# Patient Record
Sex: Female | Born: 1935 | Race: White | Hispanic: No | Smoking: Never smoker
Health system: Southern US, Community
[De-identification: ages and names within clinical notes are randomized; demographics above are authoritative.]

## PROBLEM LIST (undated history)

## (undated) DIAGNOSIS — I499 Cardiac arrhythmia, unspecified: Secondary | ICD-10-CM

## (undated) DIAGNOSIS — H269 Unspecified cataract: Secondary | ICD-10-CM

## (undated) DIAGNOSIS — I4891 Unspecified atrial fibrillation: Secondary | ICD-10-CM

## (undated) DIAGNOSIS — H3530 Unspecified macular degeneration: Secondary | ICD-10-CM

---

## 2005-02-20 ENCOUNTER — Ambulatory Visit: Payer: Self-pay

## 2005-05-02 ENCOUNTER — Ambulatory Visit: Payer: Self-pay | Admitting: Internal Medicine

## 2005-05-17 ENCOUNTER — Ambulatory Visit: Payer: Self-pay | Admitting: Gastroenterology

## 2005-08-02 ENCOUNTER — Ambulatory Visit: Payer: Self-pay | Admitting: Gastroenterology

## 2005-11-11 ENCOUNTER — Ambulatory Visit: Payer: Self-pay | Admitting: Gastroenterology

## 2005-11-20 ENCOUNTER — Ambulatory Visit: Payer: Self-pay | Admitting: Gastroenterology

## 2006-05-15 ENCOUNTER — Ambulatory Visit: Payer: Self-pay | Admitting: Unknown Physician Specialty

## 2007-05-27 ENCOUNTER — Ambulatory Visit: Payer: Self-pay | Admitting: Unknown Physician Specialty

## 2008-06-02 ENCOUNTER — Ambulatory Visit: Payer: Self-pay | Admitting: Unknown Physician Specialty

## 2009-06-21 ENCOUNTER — Ambulatory Visit: Payer: Self-pay | Admitting: Unknown Physician Specialty

## 2010-08-14 ENCOUNTER — Ambulatory Visit: Payer: Self-pay | Admitting: Unknown Physician Specialty

## 2013-01-25 ENCOUNTER — Emergency Department: Payer: Self-pay | Admitting: Emergency Medicine

## 2015-12-31 ENCOUNTER — Encounter
Admission: RE | Admit: 2015-12-31 | Discharge: 2015-12-31 | Disposition: A | Discharge status: Home / Self Care | Payer: Commercial Managed Care - HMO | Source: Ambulatory Visit | Attending: Internal Medicine | Admitting: Internal Medicine

## 2015-12-31 DIAGNOSIS — R262 Difficulty in walking, not elsewhere classified: Secondary | ICD-10-CM | POA: Insufficient documentation

## 2015-12-31 DIAGNOSIS — R410 Disorientation, unspecified: Secondary | ICD-10-CM | POA: Diagnosis present

## 2015-12-31 DIAGNOSIS — Z87440 Personal history of urinary (tract) infections: Secondary | ICD-10-CM | POA: Insufficient documentation

## 2015-12-31 LAB — COMPREHENSIVE METABOLIC PANEL
ALK PHOS: 95 U/L (ref 38–126)
ALT: 16 U/L (ref 14–54)
ANION GAP: 11 (ref 5–15)
AST: 26 U/L (ref 15–41)
Albumin: 3.6 g/dL (ref 3.5–5.0)
BILIRUBIN TOTAL: 0.9 mg/dL (ref 0.3–1.2)
BUN: 17 mg/dL (ref 6–20)
CHLORIDE: 98 mmol/L — AB (ref 101–111)
CO2: 27 mmol/L (ref 22–32)
Calcium: 9.2 mg/dL (ref 8.9–10.3)
Creatinine, Ser: 1.03 mg/dL — ABNORMAL HIGH (ref 0.44–1.00)
GFR calc Af Amer: 55 mL/min — ABNORMAL LOW (ref >60)
GFR calc non Af Amer: 47 mL/min — ABNORMAL LOW (ref >60)
GLUCOSE: 100 mg/dL — AB (ref 65–99)
POTASSIUM: 3.5 mmol/L (ref 3.5–5.1)
Sodium: 136 mmol/L (ref 135–145)
TOTAL PROTEIN: 7.2 g/dL (ref 6.5–8.1)

## 2015-12-31 LAB — CBC WITH DIFFERENTIAL/PLATELET
BASOS ABS: 0.1 10*3/uL (ref 0–0.1)
BASOS PCT: 1 %
Eosinophils Absolute: 0.3 10*3/uL (ref 0–0.7)
Eosinophils Relative: 3 %
HEMATOCRIT: 42.1 % (ref 35.0–47.0)
HEMOGLOBIN: 14.1 g/dL (ref 12.0–16.0)
LYMPHS PCT: 29 %
Lymphs Abs: 2.9 10*3/uL (ref 1.0–3.6)
MCH: 30.9 pg (ref 26.0–34.0)
MCHC: 33.4 g/dL (ref 32.0–36.0)
MCV: 92.6 fL (ref 80.0–100.0)
MONOS PCT: 11 %
Monocytes Absolute: 1.1 10*3/uL — ABNORMAL HIGH (ref 0.2–0.9)
NEUTROS ABS: 5.7 10*3/uL (ref 1.4–6.5)
NEUTROS PCT: 56 %
PLATELETS: 256 10*3/uL (ref 150–440)
RBC: 4.55 MIL/uL (ref 3.80–5.20)
RDW: 13.3 % (ref 11.5–14.5)
WBC: 10.1 10*3/uL (ref 3.6–11.0)

## 2016-01-02 ENCOUNTER — Other Ambulatory Visit: Payer: Self-pay | Admitting: Gerontology

## 2016-01-02 DIAGNOSIS — S22000A Wedge compression fracture of unspecified thoracic vertebra, initial encounter for closed fracture: Secondary | ICD-10-CM

## 2016-01-11 ENCOUNTER — Ambulatory Visit: Payer: Self-pay

## 2016-01-13 DIAGNOSIS — R410 Disorientation, unspecified: Secondary | ICD-10-CM | POA: Diagnosis not present

## 2016-01-13 LAB — URINALYSIS COMPLETE WITH MICROSCOPIC (ARMC ONLY)
BACTERIA UA: NONE SEEN
BILIRUBIN URINE: NEGATIVE
GLUCOSE, UA: NEGATIVE mg/dL
Hgb urine dipstick: NEGATIVE
Ketones, ur: NEGATIVE mg/dL
NITRITE: NEGATIVE
Protein, ur: NEGATIVE mg/dL
SPECIFIC GRAVITY, URINE: 1.005 (ref 1.005–1.030)
pH: 7 (ref 5.0–8.0)

## 2016-01-14 ENCOUNTER — Encounter (HOSPITAL_COMMUNITY)
Admission: EM | Disposition: A | Discharge status: Hospice | Payer: Self-pay | Source: Home / Self Care | Attending: Neurology

## 2016-01-14 ENCOUNTER — Emergency Department (HOSPITAL_COMMUNITY): Payer: Commercial Managed Care - HMO

## 2016-01-14 ENCOUNTER — Inpatient Hospital Stay (HOSPITAL_COMMUNITY): Payer: Commercial Managed Care - HMO | Admitting: Anesthesiology

## 2016-01-14 ENCOUNTER — Encounter (HOSPITAL_COMMUNITY): Payer: Self-pay

## 2016-01-14 ENCOUNTER — Inpatient Hospital Stay (HOSPITAL_COMMUNITY): Payer: Commercial Managed Care - HMO

## 2016-01-14 ENCOUNTER — Inpatient Hospital Stay (HOSPITAL_COMMUNITY)
Admission: EM | Admit: 2016-01-14 | Discharge: 2016-01-19 | DRG: 023 | Disposition: A | Discharge status: Hospice | Payer: Commercial Managed Care - HMO | Attending: Neurology | Admitting: Neurology

## 2016-01-14 DIAGNOSIS — I639 Cerebral infarction, unspecified: Secondary | ICD-10-CM

## 2016-01-14 DIAGNOSIS — H3530 Unspecified macular degeneration: Secondary | ICD-10-CM | POA: Diagnosis present

## 2016-01-14 DIAGNOSIS — I159 Secondary hypertension, unspecified: Secondary | ICD-10-CM

## 2016-01-14 DIAGNOSIS — Z66 Do not resuscitate: Secondary | ICD-10-CM | POA: Diagnosis present

## 2016-01-14 DIAGNOSIS — G8104 Flaccid hemiplegia affecting left nondominant side: Secondary | ICD-10-CM | POA: Diagnosis present

## 2016-01-14 DIAGNOSIS — E785 Hyperlipidemia, unspecified: Secondary | ICD-10-CM | POA: Diagnosis present

## 2016-01-14 DIAGNOSIS — I6789 Other cerebrovascular disease: Secondary | ICD-10-CM | POA: Diagnosis not present

## 2016-01-14 DIAGNOSIS — R471 Dysarthria and anarthria: Secondary | ICD-10-CM | POA: Diagnosis present

## 2016-01-14 DIAGNOSIS — J9620 Acute and chronic respiratory failure, unspecified whether with hypoxia or hypercapnia: Secondary | ICD-10-CM | POA: Diagnosis not present

## 2016-01-14 DIAGNOSIS — I610 Nontraumatic intracerebral hemorrhage in hemisphere, subcortical: Secondary | ICD-10-CM | POA: Diagnosis present

## 2016-01-14 DIAGNOSIS — D72829 Elevated white blood cell count, unspecified: Secondary | ICD-10-CM | POA: Diagnosis present

## 2016-01-14 DIAGNOSIS — E876 Hypokalemia: Secondary | ICD-10-CM | POA: Diagnosis present

## 2016-01-14 DIAGNOSIS — R29721 NIHSS score 21: Secondary | ICD-10-CM | POA: Diagnosis present

## 2016-01-14 DIAGNOSIS — R4781 Slurred speech: Secondary | ICD-10-CM | POA: Diagnosis present

## 2016-01-14 DIAGNOSIS — Z515 Encounter for palliative care: Secondary | ICD-10-CM | POA: Diagnosis not present

## 2016-01-14 DIAGNOSIS — I10 Essential (primary) hypertension: Secondary | ICD-10-CM | POA: Diagnosis present

## 2016-01-14 DIAGNOSIS — I4891 Unspecified atrial fibrillation: Secondary | ICD-10-CM

## 2016-01-14 DIAGNOSIS — G936 Cerebral edema: Secondary | ICD-10-CM | POA: Diagnosis present

## 2016-01-14 DIAGNOSIS — R29810 Facial weakness: Secondary | ICD-10-CM | POA: Diagnosis present

## 2016-01-14 DIAGNOSIS — I63412 Cerebral infarction due to embolism of left middle cerebral artery: Secondary | ICD-10-CM | POA: Diagnosis present

## 2016-01-14 DIAGNOSIS — I480 Paroxysmal atrial fibrillation: Secondary | ICD-10-CM | POA: Diagnosis present

## 2016-01-14 DIAGNOSIS — Q251 Coarctation of aorta: Secondary | ICD-10-CM

## 2016-01-14 DIAGNOSIS — I63411 Cerebral infarction due to embolism of right middle cerebral artery: Secondary | ICD-10-CM | POA: Diagnosis present

## 2016-01-14 DIAGNOSIS — R4020 Unspecified coma: Secondary | ICD-10-CM | POA: Diagnosis present

## 2016-01-14 DIAGNOSIS — H269 Unspecified cataract: Secondary | ICD-10-CM | POA: Diagnosis present

## 2016-01-14 DIAGNOSIS — I619 Nontraumatic intracerebral hemorrhage, unspecified: Secondary | ICD-10-CM

## 2016-01-14 DIAGNOSIS — D6489 Other specified anemias: Secondary | ICD-10-CM | POA: Diagnosis not present

## 2016-01-14 DIAGNOSIS — I63511 Cerebral infarction due to unspecified occlusion or stenosis of right middle cerebral artery: Secondary | ICD-10-CM | POA: Diagnosis not present

## 2016-01-14 DIAGNOSIS — D649 Anemia, unspecified: Secondary | ICD-10-CM

## 2016-01-14 DIAGNOSIS — Z978 Presence of other specified devices: Secondary | ICD-10-CM

## 2016-01-14 HISTORY — PX: RADIOLOGY WITH ANESTHESIA: SHX6223

## 2016-01-14 HISTORY — DX: Unspecified cataract: H26.9

## 2016-01-14 HISTORY — DX: Unspecified atrial fibrillation: I48.91

## 2016-01-14 HISTORY — DX: Unspecified macular degeneration: H35.30

## 2016-01-14 HISTORY — DX: Cardiac arrhythmia, unspecified: I49.9

## 2016-01-14 LAB — COMPREHENSIVE METABOLIC PANEL
ALK PHOS: 98 U/L (ref 38–126)
ALT: 14 U/L (ref 14–54)
AST: 23 U/L (ref 15–41)
Albumin: 3.3 g/dL — ABNORMAL LOW (ref 3.5–5.0)
Anion gap: 14 (ref 5–15)
BILIRUBIN TOTAL: 0.8 mg/dL (ref 0.3–1.2)
BUN: 12 mg/dL (ref 6–20)
CALCIUM: 9.5 mg/dL (ref 8.9–10.3)
CO2: 25 mmol/L (ref 22–32)
CREATININE: 0.98 mg/dL (ref 0.44–1.00)
Chloride: 96 mmol/L — ABNORMAL LOW (ref 101–111)
GFR, EST AFRICAN AMERICAN: 58 mL/min — AB (ref >60)
GFR, EST NON AFRICAN AMERICAN: 50 mL/min — AB (ref >60)
Glucose, Bld: 137 mg/dL — ABNORMAL HIGH (ref 65–99)
Potassium: 4 mmol/L (ref 3.5–5.1)
Sodium: 135 mmol/L (ref 135–145)
Total Protein: 7.2 g/dL (ref 6.5–8.1)

## 2016-01-14 LAB — CBC
HEMATOCRIT: 41.5 % (ref 36.0–46.0)
HEMOGLOBIN: 13.8 g/dL (ref 12.0–15.0)
MCH: 30.1 pg (ref 26.0–34.0)
MCHC: 33.3 g/dL (ref 30.0–36.0)
MCV: 90.6 fL (ref 78.0–100.0)
Platelets: 267 10*3/uL (ref 150–400)
RBC: 4.58 MIL/uL (ref 3.87–5.11)
RDW: 12.8 % (ref 11.5–15.5)
WBC: 8.6 10*3/uL (ref 4.0–10.5)

## 2016-01-14 LAB — I-STAT CHEM 8, ED
BUN: 17 mg/dL (ref 6–20)
CHLORIDE: 98 mmol/L — AB (ref 101–111)
Calcium, Ion: 1.03 mmol/L — ABNORMAL LOW (ref 1.13–1.30)
Creatinine, Ser: 0.9 mg/dL (ref 0.44–1.00)
Glucose, Bld: 138 mg/dL — ABNORMAL HIGH (ref 65–99)
HEMATOCRIT: 46 % (ref 36.0–46.0)
Hemoglobin: 15.6 g/dL — ABNORMAL HIGH (ref 12.0–15.0)
Potassium: 4.1 mmol/L (ref 3.5–5.1)
SODIUM: 132 mmol/L — AB (ref 135–145)
TCO2: 24 mmol/L (ref 0–100)

## 2016-01-14 LAB — RAPID URINE DRUG SCREEN, HOSP PERFORMED
Amphetamines: NOT DETECTED
Barbiturates: NOT DETECTED
Benzodiazepines: NOT DETECTED
COCAINE: NOT DETECTED
OPIATES: NOT DETECTED
Tetrahydrocannabinol: NOT DETECTED

## 2016-01-14 LAB — DIFFERENTIAL
Basophils Absolute: 0 10*3/uL (ref 0.0–0.1)
Basophils Relative: 1 %
Eosinophils Absolute: 0.2 10*3/uL (ref 0.0–0.7)
Eosinophils Relative: 2 %
LYMPHS ABS: 3.2 10*3/uL (ref 0.7–4.0)
LYMPHS PCT: 38 %
MONO ABS: 0.7 10*3/uL (ref 0.1–1.0)
MONOS PCT: 8 %
NEUTROS ABS: 4.5 10*3/uL (ref 1.7–7.7)
Neutrophils Relative %: 53 %

## 2016-01-14 LAB — PROTIME-INR
INR: 1.22 (ref 0.00–1.49)
Prothrombin Time: 15.6 seconds — ABNORMAL HIGH (ref 11.6–15.2)

## 2016-01-14 LAB — URINALYSIS, ROUTINE W REFLEX MICROSCOPIC
Bilirubin Urine: NEGATIVE
GLUCOSE, UA: NEGATIVE mg/dL
Ketones, ur: 15 mg/dL — AB
LEUKOCYTES UA: NEGATIVE
NITRITE: NEGATIVE
PH: 6 (ref 5.0–8.0)
PROTEIN: NEGATIVE mg/dL
Specific Gravity, Urine: 1.033 — ABNORMAL HIGH (ref 1.005–1.030)

## 2016-01-14 LAB — APTT: aPTT: 28 seconds (ref 24–37)

## 2016-01-14 LAB — I-STAT TROPONIN, ED: TROPONIN I, POC: 0.1 ng/mL — AB (ref 0.00–0.08)

## 2016-01-14 LAB — I-STAT CG4 LACTIC ACID, ED: Lactic Acid, Venous: 1.4 mmol/L (ref 0.5–2.0)

## 2016-01-14 LAB — URINE MICROSCOPIC-ADD ON: WBC, UA: NONE SEEN WBC/hpf (ref 0–5)

## 2016-01-14 SURGERY — RADIOLOGY WITH ANESTHESIA
Anesthesia: General

## 2016-01-14 MED ORDER — FENTANYL CITRATE (PF) 250 MCG/5ML IJ SOLN
INTRAMUSCULAR | Status: DC | PRN
  Administered 2016-01-14 – 2016-01-15 (×5): 50 ug via INTRAVENOUS

## 2016-01-14 MED ORDER — PROPOFOL 10 MG/ML IV BOLUS
INTRAVENOUS | Status: DC | PRN
  Administered 2016-01-14: 70 mg via INTRAVENOUS
  Administered 2016-01-15: 30 mg via INTRAVENOUS

## 2016-01-14 MED ORDER — FENTANYL CITRATE (PF) 250 MCG/5ML IJ SOLN
INTRAMUSCULAR | Status: AC
  Filled 2016-01-14: qty 5

## 2016-01-14 MED ORDER — VANCOMYCIN HCL IN DEXTROSE 1-5 GM/200ML-% IV SOLN
1000.0000 mg | INTRAVENOUS | Status: AC
  Administered 2016-01-14: 1000 mg via INTRAVENOUS
  Filled 2016-01-14: qty 200

## 2016-01-14 MED ORDER — GLYCOPYRROLATE 0.2 MG/ML IJ SOLN
INTRAMUSCULAR | Status: DC | PRN
  Administered 2016-01-14: 0.4 mg via INTRAVENOUS

## 2016-01-14 MED ORDER — HEPARIN SOD (PORK) LOCK FLUSH 100 UNIT/ML IV SOLN
INTRAVENOUS | Status: AC
Start: 2016-01-14 — End: 2016-01-15
  Filled 2016-01-14: qty 5

## 2016-01-14 MED ORDER — DEXTROSE 5 % IV SOLN
10.0000 mg | INTRAVENOUS | Status: DC | PRN
  Administered 2016-01-14: 10 ug/min via INTRAVENOUS

## 2016-01-14 MED ORDER — NITROGLYCERIN 1 MG/10 ML FOR IR/CATH LAB
INTRA_ARTERIAL | Status: AC
  Administered 2016-01-14 – 2016-01-15 (×2): 25 ug
  Filled 2016-01-14: qty 10

## 2016-01-14 MED ORDER — EPTIFIBATIDE 20 MG/10ML IV SOLN
INTRAVENOUS | Status: AC
  Administered 2016-01-14: 0.8 mg
  Administered 2016-01-14 (×3): 2 mg
  Filled 2016-01-14: qty 10

## 2016-01-14 MED ORDER — ESMOLOL HCL 100 MG/10ML IV SOLN
INTRAVENOUS | Status: DC | PRN
  Administered 2016-01-14: 10 mg via INTRAVENOUS
  Administered 2016-01-14: 30 mg via INTRAVENOUS

## 2016-01-14 MED ORDER — ONDANSETRON HCL 4 MG/2ML IJ SOLN
4.0000 mg | Freq: Once | INTRAMUSCULAR | Status: AC
  Administered 2016-01-14: 4 mg via INTRAVENOUS
  Filled 2016-01-14: qty 2

## 2016-01-14 MED ORDER — SUCCINYLCHOLINE CHLORIDE 20 MG/ML IJ SOLN
INTRAMUSCULAR | Status: DC | PRN
  Administered 2016-01-14: 100 mg via INTRAVENOUS

## 2016-01-14 MED ORDER — SODIUM CHLORIDE 0.9 % IV SOLN
INTRAVENOUS | Status: DC | PRN
  Administered 2016-01-14 (×2): via INTRAVENOUS

## 2016-01-14 MED ORDER — LIDOCAINE HCL (CARDIAC) 20 MG/ML IV SOLN
INTRAVENOUS | Status: DC | PRN
Start: 2016-01-14 — End: 2016-01-15
  Administered 2016-01-14: 50 mg via INTRATRACHEAL

## 2016-01-14 MED ORDER — IOHEXOL 350 MG/ML SOLN
50.0000 mL | Freq: Once | INTRAVENOUS | Status: AC | PRN
  Administered 2016-01-14: 50 mL via INTRAVENOUS

## 2016-01-14 MED ORDER — ROCURONIUM BROMIDE 100 MG/10ML IV SOLN
INTRAVENOUS | Status: DC | PRN
  Administered 2016-01-14 (×2): 50 mg via INTRAVENOUS
  Administered 2016-01-15: 20 mg via INTRAVENOUS

## 2016-01-14 NOTE — ED Notes (Addendum)
82YO female [facility] as a Code Stroke [facility].  Per staff and family who were visiting pt, at approx 1715, pt had an acute change in neuro status with left sided weakness, facial droop and significant slurred speech.  EMS reported BP 195/111, HT 50-60.  Pt was meet at bridge by Dr Cena BentonVega, RR RN, lab and pharmacy.  CT and CTA were performed and tPa was ruled out due to the presence of an ICH.  Pt's initial NIHHS 25 - see stroke log for times and details.The pt's family meet the pt in the ED exam room and was updated by Dr Cena BentonVega and RR RN.  The pt will be admitted to the ICU.  Bedside handoff with Caryn BeeKevin, ED RN and update given to Dr Lynelle DoctorKnapp - EDP.

## 2016-01-14 NOTE — Anesthesia Procedure Notes (Signed)
Procedure Name: Intubation Date/Time: 01/14/2016 9:51 PM Performed by: Brien MatesMAHONY, Vincentina Sollers D Pre-anesthesia Checklist: Patient identified, Emergency Drugs available, Suction available, Patient being monitored and Timeout performed Patient Re-evaluated:Patient Re-evaluated prior to inductionOxygen Delivery Method: Circle system utilized Preoxygenation: Pre-oxygenation with 100% oxygen Intubation Type: IV induction, Rapid sequence and Cricoid Pressure applied Laryngoscope Size: Miller and 2 Grade View: Grade I Tube type: Subglottic suction tube Tube size: 7.5 mm Number of attempts: 1 Airway Equipment and Method: Stylet Placement Confirmation: ETT inserted through vocal cords under direct vision,  positive ETCO2 and breath sounds checked- equal and bilateral Secured at: 22 cm Tube secured with: Tape Dental Injury: Teeth and Oropharynx as per pre-operative assessment

## 2016-01-14 NOTE — H&P (Addendum)
Neurology Consultation Reason for Consult: code stroke Referring Physician: ED attemding  CC: left sided weakness Original ICH score was 2 - then became intubated and could not use ICH score as she was paralyzed and sedated  History is obtained from patient's   HPI: Hayley Roth is a 82 y.o. female with a hx of untreated afib who lives at an assisted living facility and is able to walk, speak and go about her life at baseline.  Family tell me that she might have a small degree of dementia.  Today at 5:30 while eating rather suddenly stopped moving the left side of her body.  She came in as a code stroke and I examined her in the scanner but was unable to give tPA because head CT showed a subacute stroke with petechia.  CTA showed right M1 occlusion and I communicated findings immediately to interventional.  Spoke with family and they agreed with IR procedure.  Will is power of attorney [phone].   LKW: 5:30PM tpa given?: NO 2/2 hypodensity, new with petechia Premorbid modified rankin scale: 1 ICH Score: 1 NIHSS 21    ROS:  Unable to obtain due to altered mental status but family tell me that last Friday she had a spell where she could not speak and recovered.  Past Medical History  Diagnosis Date  . Arrhythmia   . Macular degeneration     right eye  . Cataract     left eye  . Atrial fibrillation (HCC)     History reviewed. No pertinent family history.  Social History:  reports that she has never smoked. She does not have any smokeless tobacco history on file. She reports that she does not drink alcohol or use illicit drugs.  Exam: Current vital signs: BP 167/80 mmHg  Pulse 66  Temp(Src) 98.4 F (36.9 C) (Oral)  Resp 21  Ht 5' (1.524 m)  Wt 70.3 kg (154 lb 15.7 oz)  BMI 30.27 kg/m2  SpO2 98% Vital signs in last 24 hours: Temp:  [98.4 F (36.9 C)] 98.4 F (36.9 C) (03/19 1913) Pulse Rate:  [66] 66 (03/19 1913) Resp:  [21] 21 (03/19 1913) BP: (167)/(80) 167/80  mmHg (03/19 1913) SpO2:  [98 %] 98 % (03/19 1913) Weight:  [70.3 kg (154 lb 15.7 oz)] 70.3 kg (154 lb 15.7 oz) (03/19 1913)   Physical Exam  Constitutional: Appears well-developed and well-nourished.  Psych: Affect appropriate to situation Eyes: No scleral injection HENT: No OP obstrucion Head: Normocephalic.  Cardiovascular: Normal rate and regular rhythm.  Respiratory: Effort normal and breath sounds normal to anterior ascultation GI: Soft.  No distension. There is no tenderness.  Skin: WDI  Neuro: Mental Status: Patient is awake, alert, but unable to answer questions - follows simple commands such as show 2 fingers Moderate aphasia Cranial Nerves: II: left hemianopsia III,IV, VI: uanble  To test forced gaze V: Facial sensation is decreased VII: Facial movement is mild droop on left VIII: hearing is intact to voice XII: tongue is midlin Motor: Tone is normal. Bulk is normal left plegia and right sided weakness as well - not able to participate in exam drops all limbs but moves right leg and arm with apparently normal strength Sensory: Sensation is decareased on elft Deep Tendon Reflexes: Unable to obtain Plantars: Toes are upgoing bilaterally Cerebellar: Unable to test    I have reviewed labs in epic and the results pertinent to this consultation are: normal renal function  I have reviewed the images  obtained: CT and CTA  Impression: acute right MCA stroke in setting of uintreated afib with evidence for right M1 occlusion.  Will go to IR.  Admit to the ICU for post thrombectomy care after procedure.  Addendum: new right BG bleed - Will need f/u CT tomorrow.  No antiplatelets or lovenox for now.  Recommendations: 1) as above

## 2016-01-14 NOTE — Anesthesia Preprocedure Evaluation (Addendum)
Anesthesia Evaluation  Patient identified by MRN, date of birth, ID band Patient confused    Reviewed: Allergy & Precautions, H&P , NPO status , Patient's Chart, lab work & pertinent test results, Unable to perform ROS - Chart review onlyPreop documentation limited or incomplete due to emergent nature of procedure.  Airway Mallampati: II  TM Distance: >3 FB Neck ROM: Full    Dental no notable dental hx. (+) Teeth Intact, Dental Advisory Given   Pulmonary neg pulmonary ROS,    Pulmonary exam normal breath sounds clear to auscultation       Cardiovascular + dysrhythmias Atrial Fibrillation  Rhythm:Regular Rate:Normal     Neuro/Psych CVA negative psych ROS   GI/Hepatic negative GI ROS, Neg liver ROS,   Endo/Other  negative endocrine ROS  Renal/GU negative Renal ROS  negative genitourinary   Musculoskeletal   Abdominal   Peds  Hematology negative hematology ROS (+)   Anesthesia Other Findings   Reproductive/Obstetrics negative OB ROS                           Anesthesia Physical Anesthesia Plan  ASA: III and emergent  Anesthesia Plan: General   Post-op Pain Management:    Induction: Intravenous, Rapid sequence and Cricoid pressure planned  Airway Management Planned: Oral ETT  Additional Equipment: Arterial line  Intra-op Plan:   Post-operative Plan: Post-operative intubation/ventilation  Informed Consent: I have reviewed the patients History and Physical, chart, labs and discussed the procedure including the risks, benefits and alternatives for the proposed anesthesia with the patient or authorized representative who has indicated his/her understanding and acceptance.   Dental advisory given, History available from chart only and Only emergency history available  Plan Discussed with: CRNA, Surgeon and Anesthesiologist  Anesthesia Plan Comments:        Anesthesia Quick  Evaluation

## 2016-01-14 NOTE — ED Provider Notes (Signed)
CSN: [id number]     Arrival date & time 01/14/16  1911 History   First MD Initiated Contact with Patient 01/14/16 1948     Chief Complaint  Patient presents with  . Code Stroke    HPI Pt presents to the ED with acute onset of left sided weakness and slurred speech that started around 5:30 pm.   Pt resides in a nursing home but normally does not have any trouble with her speech and is able to walk around.  Pt was brought in by EMS as a code stroke.  Hx is limited by pt's difficulty with her speech. Past Medical History  Diagnosis Date  . Arrhythmia   . Macular degeneration     right eye  . Cataract     left eye  . Atrial fibrillation (HCC)    History reviewed. No pertinent past surgical history. History reviewed. No pertinent family history. Social History  Substance Use Topics  . Smoking status: Never Smoker   . Smokeless tobacco: None  . Alcohol Use: No   OB History    No data available     Review of Systems  Unable to perform ROS: Mental status change      Allergies  Penicillins  Home Medications   Prior to Admission medications   Not on File   BP 167/80 mmHg  Pulse 66  Temp(Src) 98.4 F (36.9 C) (Oral)  Resp 21  Ht 5' (1.524 m)  Wt 70.3 kg  BMI 30.27 kg/m2  SpO2 98% Physical Exam  Constitutional: No distress.  Elderly, frail  HENT:  Head: Normocephalic and atraumatic.  Right Ear: External ear normal.  Left Ear: External ear normal.  Eyes: Conjunctivae are normal. Right eye exhibits no discharge. Left eye exhibits no discharge. No scleral icterus.  Neck: Neck supple. No tracheal deviation present.  Cardiovascular: Normal rate, regular rhythm and intact distal pulses.   Pulmonary/Chest: Effort normal and breath sounds normal. No stridor. No respiratory distress. She has no wheezes. She has no rales.  Abdominal: Soft. Bowel sounds are normal. She exhibits no distension. There is no tenderness. There is no rebound and no guarding.  Musculoskeletal: She  exhibits no edema or tenderness.  Neurological: She is alert. No cranial nerve deficit (Left facial droop, , aphasic stuttering speech) or sensory deficit. She exhibits normal muscle tone. She displays no seizure activity. Coordination normal.  Left-sided hemiparesis, left sided neglect  Skin: Skin is warm and dry. No rash noted.  Psychiatric: She has a normal mood and affect.  Nursing note and vitals reviewed.   ED Course  Procedures (including critical care time) Labs Review Labs Reviewed  PROTIME-INR - Abnormal; Notable for the following:    Prothrombin Time 15.6 (*)    All other components within normal limits  I-STAT CHEM 8, ED - Abnormal; Notable for the following:    Sodium 132 (*)    Chloride 98 (*)    Glucose, Bld 138 (*)    Calcium, Ion 1.03 (*)    Hemoglobin 15.6 (*)    All other components within normal limits  I-STAT TROPOININ, ED - Abnormal; Notable for the following:    Troponin i, poc 0.10 (*)    All other components within normal limits  APTT  CBC  DIFFERENTIAL  COMPREHENSIVE METABOLIC PANEL  I-STAT CG4 LACTIC ACID, ED  I-STAT TROPOININ, ED  CBG MONITORING, ED    Imaging Review Ct Head Wo Contrast  01/14/2016  CLINICAL DATA:  Stroke-like symptoms EXAM: CT  HEAD WITHOUT CONTRAST TECHNIQUE: Contiguous axial images were obtained from the base of the skull through the vertex without intravenous contrast. COMPARISON:  None. FINDINGS: The bony calvarium is intact. No gross soft tissue abnormality is noted. There are changes consistent with prior left MCA infarct in the anterior distribution of the left parietal lobe. Posteriorly in the left parietal lobe however there is an area of geographic decreased attenuation with some gyral prominence consistent with an area of acute to subacute ischemia. No other areas of acute ischemia are seen. Mild atrophic changes are noted. IMPRESSION: Changes consistent with acute to subacute ischemia in the posterior distribution of the  left MCA. An old infarct is noted in the anterior aspect of the left MCA distribution. Critical Value/emergent results were called by telephone at the time of interpretation on 01/14/2016 at 7:37 pm to Dr. Heron Sabins , who verbally acknowledged these results. Electronically Signed   By: Alcide Clever M.D.   On: 01/14/2016 19:42   I have personally reviewed and evaluated these images and lab results as part of my medical decision-making.   EKG Interpretation   Date/Time:  Sunday January 14 2016 19:51:33 EDT Ventricular Rate:  71 PR Interval:  215 QRS Duration: 123 QT Interval:  443 QTC Calculation: 481 R Axis:   53 Text Interpretation:  Sinus rhythm Borderline prolonged PR interval  Nonspecific intraventricular conduction delay Borderline T abnormalities,  inferior leads No previous tracing Confirmed by Markie Heffernan  MD-J, Tilman Mcclaren ([id number])  on 01/14/2016 8:06:58 PM Also confirmed by Yulitza Shorts  MD-J, Jerran Tappan ([id number]), editor  [place]  CCT, BEVERLY ([id number])  on 01/15/2016 7:12:06 AM      MDM   Final diagnoses:  Stroke (cerebrum) (HCC)  Stroke (cerebrum) (HCC)    Pt presents to the ED as a code stroke.  She has deficits on exam.  Pt is being evaluated by Dr Cena Benton stroke team.    Linwood Dibbles, MD 01/15/16 1003

## 2016-01-14 NOTE — ED Notes (Signed)
Pt transported to IR by this RN, Quarry managerBen RN and IR RN

## 2016-01-15 ENCOUNTER — Inpatient Hospital Stay (HOSPITAL_COMMUNITY): Payer: Commercial Managed Care - HMO

## 2016-01-15 ENCOUNTER — Encounter (HOSPITAL_COMMUNITY): Payer: Self-pay | Admitting: Interventional Radiology

## 2016-01-15 DIAGNOSIS — G936 Cerebral edema: Secondary | ICD-10-CM

## 2016-01-15 DIAGNOSIS — I63412 Cerebral infarction due to embolism of left middle cerebral artery: Secondary | ICD-10-CM

## 2016-01-15 DIAGNOSIS — I63511 Cerebral infarction due to unspecified occlusion or stenosis of right middle cerebral artery: Secondary | ICD-10-CM

## 2016-01-15 DIAGNOSIS — I619 Nontraumatic intracerebral hemorrhage, unspecified: Secondary | ICD-10-CM

## 2016-01-15 DIAGNOSIS — I6789 Other cerebrovascular disease: Secondary | ICD-10-CM

## 2016-01-15 DIAGNOSIS — J9620 Acute and chronic respiratory failure, unspecified whether with hypoxia or hypercapnia: Secondary | ICD-10-CM

## 2016-01-15 LAB — CBC WITH DIFFERENTIAL/PLATELET
Basophils Absolute: 0 10*3/uL (ref 0.0–0.1)
Basophils Relative: 0 %
Eosinophils Absolute: 0.1 10*3/uL (ref 0.0–0.7)
Eosinophils Relative: 1 %
HEMATOCRIT: 37.9 % (ref 36.0–46.0)
HEMOGLOBIN: 12.5 g/dL (ref 12.0–15.0)
LYMPHS ABS: 3.3 10*3/uL (ref 0.7–4.0)
LYMPHS PCT: 17 %
MCH: 30 pg (ref 26.0–34.0)
MCHC: 33 g/dL (ref 30.0–36.0)
MCV: 90.9 fL (ref 78.0–100.0)
MONOS PCT: 8 %
Monocytes Absolute: 1.5 10*3/uL — ABNORMAL HIGH (ref 0.1–1.0)
NEUTROS PCT: 74 %
Neutro Abs: 14.6 10*3/uL — ABNORMAL HIGH (ref 1.7–7.7)
Platelets: 280 10*3/uL (ref 150–400)
RBC: 4.17 MIL/uL (ref 3.87–5.11)
RDW: 12.9 % (ref 11.5–15.5)
WBC: 19.5 10*3/uL — AB (ref 4.0–10.5)

## 2016-01-15 LAB — GLUCOSE, CAPILLARY: Glucose-Capillary: 124 mg/dL — ABNORMAL HIGH (ref 65–99)

## 2016-01-15 LAB — POCT I-STAT 3, ART BLOOD GAS (G3+)
ACID-BASE DEFICIT: 2 mmol/L (ref 0.0–2.0)
BICARBONATE: 22.7 meq/L (ref 20.0–24.0)
O2 SAT: 96 %
PO2 ART: 86 mmHg (ref 80.0–100.0)
TCO2: 24 mmol/L (ref 0–100)
pCO2 arterial: 38.4 mmHg (ref 35.0–45.0)
pH, Arterial: 7.38 (ref 7.350–7.450)

## 2016-01-15 LAB — BASIC METABOLIC PANEL
Anion gap: 13 (ref 5–15)
BUN: 8 mg/dL (ref 6–20)
CHLORIDE: 102 mmol/L (ref 101–111)
CO2: 21 mmol/L — AB (ref 22–32)
Calcium: 8.1 mg/dL — ABNORMAL LOW (ref 8.9–10.3)
Creatinine, Ser: 0.81 mg/dL (ref 0.44–1.00)
GFR calc Af Amer: >60 mL/min (ref >60)
GFR calc non Af Amer: >60 mL/min (ref >60)
GLUCOSE: 236 mg/dL — AB (ref 65–99)
POTASSIUM: 3.1 mmol/L — AB (ref 3.5–5.1)
Sodium: 136 mmol/L (ref 135–145)

## 2016-01-15 LAB — LIPID PANEL
Cholesterol: 201 mg/dL — ABNORMAL HIGH (ref 0–200)
HDL: 24 mg/dL — AB (ref >40)
LDL Cholesterol: 142 mg/dL — ABNORMAL HIGH (ref 0–99)
Total CHOL/HDL Ratio: 8.4 RATIO
Triglycerides: 177 mg/dL — ABNORMAL HIGH (ref <150)
VLDL: 35 mg/dL (ref 0–40)

## 2016-01-15 LAB — ECHOCARDIOGRAM COMPLETE
HEIGHTINCHES: 60 in
WEIGHTICAEL: 2479.73 [oz_av]

## 2016-01-15 LAB — MRSA PCR SCREENING: MRSA BY PCR: NEGATIVE

## 2016-01-15 LAB — URINE CULTURE

## 2016-01-15 MED ORDER — MIDAZOLAM HCL 2 MG/2ML IJ SOLN
1.0000 mg | INTRAMUSCULAR | Status: DC | PRN
  Administered 2016-01-15: 2 mg via INTRAVENOUS
  Filled 2016-01-15: qty 2

## 2016-01-15 MED ORDER — ONDANSETRON HCL 4 MG/2ML IJ SOLN: 4.0000 mg | Freq: Four times a day (QID) | INTRAMUSCULAR | Status: DC | PRN

## 2016-01-15 MED ORDER — POTASSIUM CHLORIDE 20 MEQ/15ML (10%) PO SOLN: 40.0000 meq | Freq: Once | ORAL | Status: DC

## 2016-01-15 MED ORDER — ACETAMINOPHEN 650 MG RE SUPP: 650.0000 mg | RECTAL | Status: DC | PRN

## 2016-01-15 MED ORDER — NICARDIPINE HCL IN NACL 20-0.86 MG/200ML-% IV SOLN
5.0000 mg/h | INTRAVENOUS | Status: DC
  Administered 2016-01-15: 2.5 mg/h via INTRAVENOUS
  Administered 2016-01-15: 5 mg/h via INTRAVENOUS
  Administered 2016-01-15: 8 mg/h via INTRAVENOUS
  Administered 2016-01-16: 2.5 mg/h via INTRAVENOUS
  Filled 2016-01-15 (×5): qty 200

## 2016-01-15 MED ORDER — ACETAMINOPHEN 325 MG PO TABS: 650.0000 mg | ORAL_TABLET | ORAL | Status: DC | PRN

## 2016-01-15 MED ORDER — ACETAMINOPHEN 500 MG PO TABS: 1000.0000 mg | ORAL_TABLET | Freq: Four times a day (QID) | ORAL | Status: DC | PRN

## 2016-01-15 MED ORDER — DEXTROSE 5 % IV SOLN
0.0000 ug/min | INTRAVENOUS | Status: DC
  Filled 2016-01-15: qty 1

## 2016-01-15 MED ORDER — CHLORHEXIDINE GLUCONATE 0.12 % MT SOLN
OROMUCOSAL | Status: AC
  Filled 2016-01-15: qty 15

## 2016-01-15 MED ORDER — FENTANYL CITRATE (PF) 100 MCG/2ML IJ SOLN
25.0000 ug | INTRAMUSCULAR | Status: DC | PRN
  Administered 2016-01-16: 50 ug via INTRAVENOUS
  Administered 2016-01-16: 25 ug via INTRAVENOUS
  Administered 2016-01-17: 50 ug via INTRAVENOUS
  Filled 2016-01-15 (×3): qty 2

## 2016-01-15 MED ORDER — POTASSIUM CHLORIDE 10 MEQ/100ML IV SOLN
10.0000 meq | INTRAVENOUS | Status: AC
  Administered 2016-01-15 (×2): 10 meq via INTRAVENOUS
  Filled 2016-01-15 (×2): qty 100

## 2016-01-15 MED ORDER — SODIUM CHLORIDE 0.9 % IV SOLN
INTRAVENOUS | Status: DC
  Administered 2016-01-17 – 2016-01-18 (×2): via INTRAVENOUS

## 2016-01-15 MED ORDER — LABETALOL HCL 5 MG/ML IV SOLN: 10.0000 mg | INTRAVENOUS | Status: DC | PRN

## 2016-01-15 MED ORDER — PHENYLEPHRINE HCL 10 MG/ML IJ SOLN
INTRAMUSCULAR | Status: DC | PRN
  Administered 2016-01-15: 40 ug via INTRAVENOUS

## 2016-01-15 MED ORDER — PROPOFOL 1000 MG/100ML IV EMUL
5.0000 ug/kg/min | INTRAVENOUS | Status: DC
  Administered 2016-01-15: 15 ug/kg/min via INTRAVENOUS

## 2016-01-15 MED ORDER — ATORVASTATIN CALCIUM 40 MG PO TABS
40.0000 mg | ORAL_TABLET | Freq: Every day | ORAL | Status: DC
  Filled 2016-01-15: qty 1

## 2016-01-15 MED ORDER — ANTISEPTIC ORAL RINSE SOLUTION (CORINZ)
7.0000 mL | OROMUCOSAL | Status: DC
  Administered 2016-01-15 – 2016-01-17 (×22): 7 mL via OROMUCOSAL

## 2016-01-15 MED ORDER — ACETAMINOPHEN 650 MG RE SUPP: 650.0000 mg | Freq: Four times a day (QID) | RECTAL | Status: DC | PRN

## 2016-01-15 MED ORDER — PANTOPRAZOLE SODIUM 40 MG IV SOLR
40.0000 mg | Freq: Every day | INTRAVENOUS | Status: DC
  Administered 2016-01-15 – 2016-01-16 (×2): 40 mg via INTRAVENOUS
  Filled 2016-01-15 (×2): qty 40

## 2016-01-15 MED ORDER — CHLORHEXIDINE GLUCONATE 0.12% ORAL RINSE (MEDLINE KIT)
15.0000 mL | Freq: Two times a day (BID) | OROMUCOSAL | Status: DC
  Administered 2016-01-15 – 2016-01-17 (×5): 15 mL via OROMUCOSAL

## 2016-01-15 MED ORDER — STROKE: EARLY STAGES OF RECOVERY BOOK
Freq: Once | Status: DC
  Filled 2016-01-15: qty 1

## 2016-01-15 MED ORDER — SODIUM CHLORIDE 0.9 % IV SOLN
INTRAVENOUS | Status: DC
  Administered 2016-01-15 – 2016-01-16 (×2): via INTRAVENOUS

## 2016-01-15 NOTE — Progress Notes (Signed)
Inpatient Diabetes Program Recommendations  AACE/ADA: New Consensus Statement on Inpatient Glycemic Control (2015)  Target Ranges:  Prepandial:   less than 140 mg/dL      Peak postprandial:   less than 180 mg/dL (1-2 hours)      Critically ill patients:  140 - 180 mg/dL   Review of Glycemic Control  Diabetes history: None  Current orders for Inpatient glycemic control: None  Inpatient Diabetes Program Recommendations: Correction (SSI): Lab glucose this am was 236 mg/dl. Please consider ordering CBGs and ICU glycemic control protocol with the SQ insulin Q4 hrs.  Thanks,  Christena DeemShannon Yamilett Anastos RN, MSN, [facility] Inpatient Diabetes Coordinator Team Pager [phone] (8a-5p)

## 2016-01-15 NOTE — Progress Notes (Signed)
SLP Cancellation Note  Patient Details Name: Naoma DienerMary A Mathieu MRN: [id number] DOB: 11/07/1926   Cancelled treatment:       Reason Eval/Treat Not Completed: Medical issues which prohibited therapy. Intubated. Will follow for readiness   Shanyce Daris, Riley NearingBonnie Caroline 01/15/2016, 7:52 AM

## 2016-01-15 NOTE — Consult Note (Signed)
PULMONARY / CRITICAL CARE MEDICINE   Name: Hayley Roth MRN: [id number] DOB: Jun 10, 1927    ADMISSION DATE:  01/14/2016 CONSULTATION DATE:  01/14/16  REFERRING MD:  Saralyn Pilar  CHIEF COMPLAINT:  Ischemic stroke  HISTORY OF PRESENT ILLNESS:   Hayley Roth is an 49F with history of atrial fibrillation who presented to the ED as a code stroke after acute onset of left sided weakness, facial droop and slurred speech. She was initially hypertensive. Initial stroke scale 25. CT and CTA demonstrated right M1 occlusion. No tPA was given 2/2 findings of subacute stroke with petechiae. IR was consulted and took the patient for angiography with partial embolectomy; this procedure was not successful, however. She was intubated for the case and brought to the ICU on a ventilator and on propofol with paralytics still on board.   Per the notes, prior to this event she was able to live fairly independently in an assisted living facility with perhaps some very mild dementia. Her family confirms this. They state that she does have a living will and was quite clear that she would not want to be on prolonged life support. They think she would have wanted to be DNR in this situation.   PAST MEDICAL HISTORY :  She  has a past medical history of Arrhythmia; Macular degeneration; Cataract; and Atrial fibrillation (HCC).  PAST SURGICAL HISTORY: She  has no past surgical history on file.  Allergies  Allergen Reactions  . Iodinated Diagnostic Agents Itching  . Penicillins Other (See Comments)  . Sulfa Antibiotics Rash    No current facility-administered medications on file prior to encounter.   No current outpatient prescriptions on file prior to encounter.    FAMILY HISTORY:  Her has no family status information on file.   SOCIAL HISTORY: She  reports that she has never smoked. She does not have any smokeless tobacco history on file. She reports that she does not drink alcohol or use illicit drugs.  REVIEW  OF SYSTEMS:   Unable to obtain 2/2 intubated state  SUBJECTIVE:    VITAL SIGNS: BP 163/91 mmHg  Pulse 74  Temp(Src) 98.4 F (36.9 C) (Oral)  Resp 18  Ht 5' (1.524 m)  Wt 70.3 kg (154 lb 15.7 oz)  BMI 30.27 kg/m2  SpO2 100%  HEMODYNAMICS:    VENTILATOR SETTINGS: Vent Mode:  [-] PRVC FiO2 (%):  [40 %] 40 % Set Rate:  [18 bmp] 18 bmp Vt Set:  [370 mL] 370 mL PEEP:  [5 cmH20] 5 cmH20 Plateau Pressure:  [16 cmH20] 16 cmH20  INTAKE / OUTPUT:    PHYSICAL EXAMINATION:  General Well nourished, well developed, intubated, sedated  HEENT No gross abnormalities. ETT in place.   Pulmonary Coarse breath sounds bilaterally with no wheezes, rales or ronchi. Vent-assisted. Symmetrical expansion.   Cardiovascular Normal rate, irregularly irregular rhythm. S1, s2. No m/r/g. Distal pulses palpable.  Abdomen Soft, non-tender, non-distended, positive bowel sounds, no palpable organomegaly or masses. Normoresonant to percussion.  Musculoskeletal No bony abnormalities.  Lymphatics No cervical, supraclavicular or axillary adenopathy.   Neurologic Pupils equal (4mm), round, reactive. Exam limited by recent paralytic and propofol. No withdrawal to noxious stimuli. No spontaneous movement.   Skin/Integuement No rash, no cyanosis, no clubbing. Venous stasis changes bilateral lower extremities R > L.      LABS:  BMET  Recent Labs Lab 01/14/16 1920 01/14/16 1921  NA 135 132*  K 4.0 4.1  CL 96* 98*  CO2 25  --  BUN 12 17  CREATININE 0.98 0.90  GLUCOSE 137* 138*    Electrolytes  Recent Labs Lab 01/14/16 1920  CALCIUM 9.5    CBC  Recent Labs Lab 01/14/16 1920 01/14/16 1921  WBC 8.6  --   HGB 13.8 15.6*  HCT 41.5 46.0  PLT 267  --     Coag's  Recent Labs Lab 01/14/16 1920  APTT 28  INR 1.22    Sepsis Markers  Recent Labs Lab 01/14/16 1921  LATICACIDVEN 1.40    ABG No results for input(s): PHART, PCO2ART, PO2ART in the last 168 hours.  Liver  Enzymes  Recent Labs Lab 01/14/16 1920  AST 23  ALT 14  ALKPHOS 98  BILITOT 0.8  ALBUMIN 3.3*    Cardiac Enzymes No results for input(s): TROPONINI, PROBNP in the last 168 hours.  Glucose No results for input(s): GLUCAP in the last 168 hours.  Imaging Ct Angio Head W/cm &/or Wo Cm  01/14/2016  CLINICAL DATA:  Confusion.  Stroke-like symptoms. EXAM: CT ANGIOGRAPHY HEAD AND NECK TECHNIQUE: Multidetector CT imaging of the head and neck was performed using the standard protocol during bolus administration of intravenous contrast. Multiplanar CT image reconstructions and MIPs were obtained to evaluate the vascular anatomy. Carotid stenosis measurements (when applicable) are obtained utilizing NASCET criteria, using the distal internal carotid diameter as the denominator. CONTRAST:  50mL OMNIPAQUE IOHEXOL 350 MG/ML SOLN COMPARISON:  Head CT from earlier today FINDINGS: CTA NECK Aortic arch: Atherosclerosis without aneurysm or dissection. Three vessel branching. Right carotid system: Atherosclerosis at the bifurcation. There is beading of the mid and distal ICA compatible with fibromuscular dysplasia. No indication of acute dissection. No flow limiting stenosis. Left carotid system: Beading at the mid ICA consistent with fibromuscular dysplasia. No evidence of acute dissection. No flow limiting stenosis. Atherosclerosis at the bifurcation. Vertebral arteries:Proximal subclavian atherosclerosis without flow limiting stenosis. Codominant vertebral arteries without signs of dissection. No flow limiting stenosis. Skeleton: No contributory findings. Other neck: No incidental mass or adenopathy in the neck CTA HEAD Anterior circulation: Proximal right M1 occlusion. Under filled vessels throughout the right MCA territory with large collaterals around the right parietal lobe. These are so large that initially they are concerning for sequela of fistula, but there is no early venous opacification. Infarct is  visible in the insula at least. There is a subacute infarct in the left parietal lobe as reported on prior study. Petechial hemorrhage is present within this infarct. Patient has history of untreated atrial fibrillation. Mildly diminutive vessels traversing the chronic left frontal infarct which effects the operculum and anterior insula as well as higher left frontal cortex. Posterior circulation: Symmetric vertebral arteries and vertebrobasilar branching. There is a mild to moderate right P1 segment stenosis attributed to atherosclerosis. Venous sinuses: Patent. Anatomic variants: None significant. Critical Value/emergent results were called by telephone at the time of interpretation on 01/14/2016 at 8:21 pm to Dr. Heron Sabins , who verbally acknowledged these results. IMPRESSION: 1. Right M1 occlusion with infarct is seen in the insula. Large collaterals over the right parietal convexity. 2. Subacute left parietal infarct with petechial hemorrhage. 3. Fibromuscular dysplasia seen in the cervical ICAs. Electronically Signed   By: Marnee Spring M.D.   On: 01/14/2016 20:30   Ct Head Wo Contrast  01/14/2016  CLINICAL DATA:  Stroke-like symptoms EXAM: CT HEAD WITHOUT CONTRAST TECHNIQUE: Contiguous axial images were obtained from the base of the skull through the vertex without intravenous contrast. COMPARISON:  None. FINDINGS: The bony  calvarium is intact. No gross soft tissue abnormality is noted. There are changes consistent with prior left MCA infarct in the anterior distribution of the left parietal lobe. Posteriorly in the left parietal lobe however there is an area of geographic decreased attenuation with some gyral prominence consistent with an area of acute to subacute ischemia. No other areas of acute ischemia are seen. Mild atrophic changes are noted. IMPRESSION: Changes consistent with acute to subacute ischemia in the posterior distribution of the left MCA. An old infarct is noted in the anterior aspect  of the left MCA distribution. Critical Value/emergent results were called by telephone at the time of interpretation on 01/14/2016 at 7:37 pm to Dr. Heron SabinsJOSE VEGA , who verbally acknowledged these results. Electronically Signed   By: Alcide CleverMark  Lukens M.D.   On: 01/14/2016 19:42   Ct Angio Neck W/cm &/or Wo/cm  01/14/2016  CLINICAL DATA:  Confusion.  Stroke-like symptoms. EXAM: CT ANGIOGRAPHY HEAD AND NECK TECHNIQUE: Multidetector CT imaging of the head and neck was performed using the standard protocol during bolus administration of intravenous contrast. Multiplanar CT image reconstructions and MIPs were obtained to evaluate the vascular anatomy. Carotid stenosis measurements (when applicable) are obtained utilizing NASCET criteria, using the distal internal carotid diameter as the denominator. CONTRAST:  50mL OMNIPAQUE IOHEXOL 350 MG/ML SOLN COMPARISON:  Head CT from earlier today FINDINGS: CTA NECK Aortic arch: Atherosclerosis without aneurysm or dissection. Three vessel branching. Right carotid system: Atherosclerosis at the bifurcation. There is beading of the mid and distal ICA compatible with fibromuscular dysplasia. No indication of acute dissection. No flow limiting stenosis. Left carotid system: Beading at the mid ICA consistent with fibromuscular dysplasia. No evidence of acute dissection. No flow limiting stenosis. Atherosclerosis at the bifurcation. Vertebral arteries:Proximal subclavian atherosclerosis without flow limiting stenosis. Codominant vertebral arteries without signs of dissection. No flow limiting stenosis. Skeleton: No contributory findings. Other neck: No incidental mass or adenopathy in the neck CTA HEAD Anterior circulation: Proximal right M1 occlusion. Under filled vessels throughout the right MCA territory with large collaterals around the right parietal lobe. These are so large that initially they are concerning for sequela of fistula, but there is no early venous opacification. Infarct is  visible in the insula at least. There is a subacute infarct in the left parietal lobe as reported on prior study. Petechial hemorrhage is present within this infarct. Patient has history of untreated atrial fibrillation. Mildly diminutive vessels traversing the chronic left frontal infarct which effects the operculum and anterior insula as well as higher left frontal cortex. Posterior circulation: Symmetric vertebral arteries and vertebrobasilar branching. There is a mild to moderate right P1 segment stenosis attributed to atherosclerosis. Venous sinuses: Patent. Anatomic variants: None significant. Critical Value/emergent results were called by telephone at the time of interpretation on 01/14/2016 at 8:21 pm to Dr. Heron SabinsJOSE VEGA , who verbally acknowledged these results. IMPRESSION: 1. Right M1 occlusion with infarct is seen in the insula. Large collaterals over the right parietal convexity. 2. Subacute left parietal infarct with petechial hemorrhage. 3. Fibromuscular dysplasia seen in the cervical ICAs. Electronically Signed   By: Marnee SpringJonathon  Watts M.D.   On: 01/14/2016 20:30     STUDIES:    CULTURES: None  ANTIBIOTICS: None  SIGNIFICANT EVENTS:   LINES/TUBES: PIV ETT 3/19  DISCUSSION: [name] is an [age]F with M1 ischemic stroke s/p failed attempt at revascularization and not a tPA candidate. Her deficits in the ED were significant dysarthria and L sided weakness. Her family (3  of 6 children) understand that her prognosis is guarded and have changed her code status to DNR.  She has a history of atrial fibrillation, not previously on anticoagulation.   ASSESSMENT / PLAN:  PULMONARY A: Need for mechanical ventilation secondary to recent procedures and mental status P:   CXR to confirm tube placement Continue ventilatory support Wean as tolerated SBT when able  CARDIOVASCULAR A:  Paroxysmal atrial fibrillation Hypertension P:  Not currently a candidate for  anticoagulation nicardipene to maintain BP goal  RENAL A:   No acute issues P:   Monitor renal function / UOP  GASTROINTESTINAL A:   No acute issues P:   NPO  HEMATOLOGIC A:   No acute issues P:  Monitor  INFECTIOUS A:   No acute issues P:   Monitor  ENDOCRINE A:   No acute issues P:   Monitor  NEUROLOGIC A:   M1 territory ischemic stroke Subacute stroke w/ petechiae P:   RASS goal: 0 to -1 Neurology following  BP goal 120-140 systolic Propofol as needed for sedation Follow up imaging in AM   FAMILY  - Updates: 3 children (including POA, Will) at bedside  - Inter-disciplinary family meet or Palliative Care meeting due by:  day 7  The patient is critically ill with multiple organ system failure and requires high complexity decision making for assessment and support, frequent evaluation and titration of therapies, advanced monitoring, review of radiographic studies and interpretation of complex data.   Critical Care Time devoted to patient care services, exclusive of separately billable procedures, described in this note is 40 minutes.   Nita Sickle, MD Pulmonary and Critical Care Medicine [facility] Pager: [phone]  01/15/2016, 1:10 AM

## 2016-01-15 NOTE — Consult Note (Signed)
PULMONARY / CRITICAL CARE MEDICINE   Name: Hayley Roth MRN: [id number] DOB: Apr 03, 1927    ADMISSION DATE:  01/14/2016 CONSULTATION DATE:  01/14/16  REFERRING MD:  Saralyn Pilar  CHIEF COMPLAINT:  Ischemic stroke  HISTORY OF PRESENT ILLNESS:   Hayley Roth is an 63F with history of atrial fibrillation who presented to the ED as a code stroke after acute onset of left sided weakness, facial droop and slurred speech. She was initially hypertensive. Initial stroke scale 25. CT and CTA demonstrated right M1 occlusion. No tPA was given 2/2 findings of subacute stroke with petechiae. IR was consulted and took the patient for angiography with partial embolectomy; this procedure was not successful, however. She was intubated for the case and brought to the ICU on a ventilator and on propofol with paralytics still on board.   Per the notes, prior to this event she was able to live fairly independently in an assisted living facility with perhaps some very mild dementia. Her family confirms this. They state that she does have a living will and was quite clear that she would not want to be on prolonged life support. They think she would have wanted to be DNR in this situation.     SUBJECTIVE:  Afebrile No obvious pain Bp was low  -improved   VITAL SIGNS: BP 129/68 mmHg  Pulse 72  Temp(Src) 95.8 F (35.4 C) (Axillary)  Resp 19  Ht 5' (1.524 m)  Wt 154 lb 15.7 oz (70.3 kg)  BMI 30.27 kg/m2  SpO2 100%  HEMODYNAMICS:    VENTILATOR SETTINGS: Vent Mode:  [-] PRVC FiO2 (%):  [40 %] 40 % Set Rate:  [18 bmp] 18 bmp Vt Set:  [370 mL] 370 mL PEEP:  [5 cmH20] 5 cmH20 Plateau Pressure:  [15 cmH20-16 cmH20] 15 cmH20  INTAKE / OUTPUT: I/O last 3 completed shifts: In: 1306.2 [I.V.:1306.2] Out: 2365 [Urine:2265; Blood:100]  PHYSICAL EXAMINATION:  General Well nourished, well developed, intubated, sedated  HEENT No gross abnormalities. ETT in place.   Pulmonary Coarse breath sounds bilaterally  with no wheezes, rales or ronchi.  Symmetrical expansion.   Cardiovascular Normal rate, irregularly irregular rhythm. S1, s2. No m/r/g. Distal pulses palpable.  Abdomen Soft, non-tender, non-distended, positive bowel sounds, no palpable organomegaly or masses.   Musculoskeletal No bony abnormalities.  Lymphatics No cervical, supraclavicular or axillary adenopathy.   Neurologic Pupils equal (4mm), round, reactive. puposeful on rt, hemiplegic on left   Skin/Integuement No rash, no cyanosis, no clubbing. Venous stasis changes bilateral lower extremities R > L.      LABS:  BMET  Recent Labs Lab 01/14/16 1920 01/14/16 1921 01/15/16 0450  NA 135 132* 136  K 4.0 4.1 3.1*  CL 96* 98* 102  CO2 25  --  21*  BUN CREATININE 0.98 0.90 0.81  GLUCOSE 137* 138* 236*    Electrolytes  Recent Labs Lab 01/14/16 1920 01/15/16 0450  CALCIUM 9.5 8.1*    CBC  Recent Labs Lab 01/14/16 1920 01/14/16 1921 01/15/16 0450  WBC 8.6  --  19.5*  HGB 13.8 15.6* 12.5  HCT 41.5 46.0 37.9  PLT 267  --  280    Coag's  Recent Labs Lab 01/14/16 1920  APTT 28  INR 1.22    Sepsis Markers  Recent Labs Lab 01/14/16 1921  LATICACIDVEN 1.40    ABG  Recent Labs Lab 01/15/16 0226  PHART 7.380  PCO2ART 38.4  PO2ART 86.0    Liver Enzymes  Recent Labs Lab 01/14/16 1920  AST 23  ALT 14  ALKPHOS 98  BILITOT 0.8  ALBUMIN 3.3*    Cardiac Enzymes No results for input(s): TROPONINI, PROBNP in the last 168 hours.  Glucose No results for input(s): GLUCAP in the last 168 hours.  Imaging Ct Angio Head W/cm &/or Wo Cm  01/14/2016  CLINICAL DATA:  Confusion.  Stroke-like symptoms. EXAM: CT ANGIOGRAPHY HEAD AND NECK TECHNIQUE: Multidetector CT imaging of the head and neck was performed using the standard protocol during bolus administration of intravenous contrast. Multiplanar CT image reconstructions and MIPs were obtained to evaluate the vascular anatomy. Carotid stenosis  measurements (when applicable) are obtained utilizing NASCET criteria, using the distal internal carotid diameter as the denominator. CONTRAST:  50mL OMNIPAQUE IOHEXOL 350 MG/ML SOLN COMPARISON:  Head CT from earlier today FINDINGS: CTA NECK Aortic arch: Atherosclerosis without aneurysm or dissection. Three vessel branching. Right carotid system: Atherosclerosis at the bifurcation. There is beading of the mid and distal ICA compatible with fibromuscular dysplasia. No indication of acute dissection. No flow limiting stenosis. Left carotid system: Beading at the mid ICA consistent with fibromuscular dysplasia. No evidence of acute dissection. No flow limiting stenosis. Atherosclerosis at the bifurcation. Vertebral arteries:Proximal subclavian atherosclerosis without flow limiting stenosis. Codominant vertebral arteries without signs of dissection. No flow limiting stenosis. Skeleton: No contributory findings. Other neck: No incidental mass or adenopathy in the neck CTA HEAD Anterior circulation: Proximal right M1 occlusion. Under filled vessels throughout the right MCA territory with large collaterals around the right parietal lobe. These are so large that initially they are concerning for sequela of fistula, but there is no early venous opacification. Infarct is visible in the insula at least. There is a subacute infarct in the left parietal lobe as reported on prior study. Petechial hemorrhage is present within this infarct. Patient has history of untreated atrial fibrillation. Mildly diminutive vessels traversing the chronic left frontal infarct which effects the operculum and anterior insula as well as higher left frontal cortex. Posterior circulation: Symmetric vertebral arteries and vertebrobasilar branching. There is a mild to moderate right P1 segment stenosis attributed to atherosclerosis. Venous sinuses: Patent. Anatomic variants: None significant. Critical Value/emergent results were called by telephone at  the time of interpretation on 01/14/2016 at 8:21 pm to Dr. Heron Roth , who verbally acknowledged these results. IMPRESSION: 1. Right M1 occlusion with infarct is seen in the insula. Large collaterals over the right parietal convexity. 2. Subacute left parietal infarct with petechial hemorrhage. 3. Fibromuscular dysplasia seen in the cervical ICAs. Electronically Signed   By: Marnee Spring M.D.   On: 01/14/2016 20:30   Ct Head Wo Contrast  01/14/2016  CLINICAL DATA:  Stroke-like symptoms EXAM: CT HEAD WITHOUT CONTRAST TECHNIQUE: Contiguous axial images were obtained from the base of the skull through the vertex without intravenous contrast. COMPARISON:  None. FINDINGS: The bony calvarium is intact. No gross soft tissue abnormality is noted. There are changes consistent with prior left MCA infarct in the anterior distribution of the left parietal lobe. Posteriorly in the left parietal lobe however there is an area of geographic decreased attenuation with some gyral prominence consistent with an area of acute to subacute ischemia. No other areas of acute ischemia are seen. Mild atrophic changes are noted. IMPRESSION: Changes consistent with acute to subacute ischemia in the posterior distribution of the left MCA. An old infarct is noted in the anterior aspect of the left MCA distribution. Critical Value/emergent results were called by telephone  at the time of interpretation on 01/14/2016 at 7:37 pm to Dr. Heron Roth , who verbally acknowledged these results. Electronically Signed   By: Alcide Clever M.D.   On: 01/14/2016 19:42   Ct Angio Neck W/cm &/or Wo/cm  01/14/2016  CLINICAL DATA:  Confusion.  Stroke-like symptoms. EXAM: CT ANGIOGRAPHY HEAD AND NECK TECHNIQUE: Multidetector CT imaging of the head and neck was performed using the standard protocol during bolus administration of intravenous contrast. Multiplanar CT image reconstructions and MIPs were obtained to evaluate the vascular anatomy. Carotid stenosis  measurements (when applicable) are obtained utilizing NASCET criteria, using the distal internal carotid diameter as the denominator. CONTRAST:  50mL OMNIPAQUE IOHEXOL 350 MG/ML SOLN COMPARISON:  Head CT from earlier today FINDINGS: CTA NECK Aortic arch: Atherosclerosis without aneurysm or dissection. Three vessel branching. Right carotid system: Atherosclerosis at the bifurcation. There is beading of the mid and distal ICA compatible with fibromuscular dysplasia. No indication of acute dissection. No flow limiting stenosis. Left carotid system: Beading at the mid ICA consistent with fibromuscular dysplasia. No evidence of acute dissection. No flow limiting stenosis. Atherosclerosis at the bifurcation. Vertebral arteries:Proximal subclavian atherosclerosis without flow limiting stenosis. Codominant vertebral arteries without signs of dissection. No flow limiting stenosis. Skeleton: No contributory findings. Other neck: No incidental mass or adenopathy in the neck CTA HEAD Anterior circulation: Proximal right M1 occlusion. Under filled vessels throughout the right MCA territory with large collaterals around the right parietal lobe. These are so large that initially they are concerning for sequela of fistula, but there is no early venous opacification. Infarct is visible in the insula at least. There is a subacute infarct in the left parietal lobe as reported on prior study. Petechial hemorrhage is present within this infarct. Patient has history of untreated atrial fibrillation. Mildly diminutive vessels traversing the chronic left frontal infarct which effects the operculum and anterior insula as well as higher left frontal cortex. Posterior circulation: Symmetric vertebral arteries and vertebrobasilar branching. There is a mild to moderate right P1 segment stenosis attributed to atherosclerosis. Venous sinuses: Patent. Anatomic variants: None significant. Critical Value/emergent results were called by telephone at  the time of interpretation on 01/14/2016 at 8:21 pm to Dr. Heron Roth , who verbally acknowledged these results. IMPRESSION: 1. Right M1 occlusion with infarct is seen in the insula. Large collaterals over the right parietal convexity. 2. Subacute left parietal infarct with petechial hemorrhage. 3. Fibromuscular dysplasia seen in the cervical ICAs. Electronically Signed   By: Marnee Spring M.D.   On: 01/14/2016 20:30   Dg Chest Port 1 View  01/15/2016  CLINICAL DATA:  Intubation. EXAM: PORTABLE CHEST 1 VIEW COMPARISON:  None. FINDINGS: Endotracheal tube present with tip measuring 4.3 cm above the carina. Shallow inspiration. Normal heart size and pulmonary vascularity. Lungs are clear. No blunting of costophrenic angles. No pneumothorax. IMPRESSION: Endotracheal tube with tip measuring 4.3 cm above the carina. Shallow inspiration. Lungs clear. Electronically Signed   By: Burman Nieves M.D.   On: 01/15/2016 02:04   Ct Head Code Stroke W/o Cm  01/15/2016  CLINICAL DATA:  Follow-up partial revascularization of occluded right M1 segment EXAM: CT HEAD WITHOUT CONTRAST TECHNIQUE: Contiguous axial images were obtained from the base of the skull through the vertex without intravenous contrast. COMPARISON:  01/14/2016 FINDINGS: There is new acute intraparenchymal hemorrhage in the right basal ganglia extending up into the right centrum semiovale. There is mildly increased mass effect associated with the known left parietal hemorrhage. No extra-axial collection  is evident. No other significant interval change. Basal cisterns remain patent. There is moderate generalized atrophy. There is encephalomalacia due to prior left frontal infarction. IMPRESSION: Acute intraparenchymal hemorrhage in the right basal ganglia extending up into the centrum semiovale. Mildly increased edema and mass effect associated with the known left parietal hemorrhage. Critical Value/emergent results were called by telephone at the time of  interpretation on 01/15/2016 at 1:12 am to nurse Alinda Moneyony, who verbally acknowledged these results. Electronically Signed   By: Ellery Plunkaniel R Mitchell M.D.   On: 01/15/2016 01:10     STUDIES:    CULTURES: None  ANTIBIOTICS: None  SIGNIFICANT EVENTS:   LINES/TUBES: PIV ETT 3/19  DISCUSSION: [name] is an [age]F with M1 ischemic stroke s/p failed attempt at revascularization and not a tPA candidate. Her deficits in the ED were significant dysarthria and L sided weakness. Her family (3 of 6 children) understand that her prognosis is guarded and have changed her code status to DNR.  She has a history of atrial fibrillation, not previously on anticoagulation.   ASSESSMENT / PLAN:  PULMONARY A: Need for mechanical ventilation secondary to recent procedures and mental status P:   SBTs when able but no extubation  CARDIOVASCULAR A:  Paroxysmal atrial fibrillation Hypertension P:  Not currently a candidate for anticoagulation Bp in goal range  RENAL A:   hypokalemia P:   Monitor renal function / UOP replet lytes as needed  GASTROINTESTINAL A:   No acute issues P:   NPO  HEMATOLOGIC A:   No acute issues P:  Monitor  INFECTIOUS A:   No acute issues P:   Monitor  ENDOCRINE A:   No acute issues P:   Monitor  NEUROLOGIC A:   M1 territory ischemic stroke Subacute stroke w/ petechiae P:   RASS goal: 0  Neurology following  BP goal 120-140 systolic Fent as needed for sedation Follow up CT in AM   FAMILY  - Updates: 3 children (including POA, Will) at bedside by dr Pearlean Browniesethi, one way extubation eventually  - Inter-disciplinary family meet or Palliative Care meeting due by:  day 7  The patient is critically ill with multiple organ system failure and requires high complexity decision making for assessment and support, frequent evaluation and titration of therapies, advanced monitoring, review of radiographic studies and interpretation of complex data.    Critical Care Time devoted to patient care services, exclusive of separately billable procedures, described in this note is 31 minutes.     01/15/2016, 9:57 AM

## 2016-01-15 NOTE — Progress Notes (Signed)
Initial Nutrition Assessment  DOCUMENTATION CODES:   Obesity unspecified  INTERVENTION:  If pt remains intubated for >/= 24-48 hours, recommend starting nutrition.  Recommend Vital High Protein @ 20 ml/hr and increase by 10 ml every 4 hours to goal rate of 30 ml/hr with 30 ml Prostat BID to provide 920 kcal, 93 grams of protein, and 605 ml of H2O.   RD to continue to monitor.   NUTRITION DIAGNOSIS:   Inadequate oral intake related to inability to eat as evidenced by NPO status.  GOAL:   Provide needs based on ASPEN/SCCM guidelines  MONITOR:   Vent status, Weight trends, Labs, I & O's  REASON FOR ASSESSMENT:   Ventilator    ASSESSMENT:   74F with M1 ischemic stroke s/p failed attempt at revascularization and not a tPA candidate. Her deficits in the ED were significant dysarthria and L sided weakness. Code status changed to DNR.   Per MD note, SBTs when able but no extubation.   Patient is currently intubated on ventilator support MV: 8.2 L/min Temp (24hrs), Avg:97.9 F (36.6 C), Min:95.8 F (35.4 C), Max:98.6 F (37 C)  Propofol: Off  No family at bedside. Unable to obtain nutrition or weight history. Nutrition-Focused physical exam completed. Findings are no fat depletion, mild muscle depletion, and no edema.   Labs and medications reviewed.   Diet Order:  Diet NPO time specified  Skin:   (Incision on R groin)  Last BM:  Unknown  Height:   Ht Readings from Last 1 Encounters:  01/14/16 5' (1.524 m)    Weight:   Wt Readings from Last 1 Encounters:  01/14/16 154 lb 15.7 oz (70.3 kg)    Ideal Body Weight:  45.45 kg  BMI:  Body mass index is 30.27 kg/(m^2).  Estimated Nutritional Needs:   Kcal:  773-984  Protein:  90-100 grams  Fluid:  >/= 1.5 L/day  EDUCATION NEEDS:   No education needs identified at this time  Roslyn SmilingStephanie Carmichael Burdette, MS, RD, LDN Pager # [phone] After hours/ weekend pager # [phone]

## 2016-01-15 NOTE — Transfer of Care (Signed)
Immediate Anesthesia Transfer of Care Note  Patient: Hayley Roth  Procedure(s) Performed: Procedure(s): RADIOLOGY WITH ANESTHESIA (N/A)  Patient Location: NICU  Anesthesia Type:General  Level of Consciousness: sedated  Airway & Oxygen Therapy: Patient remains intubated per anesthesia plan and Patient placed on Ventilator (see vital sign flow sheet for setting)  Post-op Assessment: Report given to RN and Post -op Vital signs reviewed and stable  Post vital signs: Reviewed and stable  Last Vitals:  Filed Vitals:   01/14/16 2045 01/14/16 2100  BP: 169/101 163/91  Pulse: 72 74  Temp:    Resp: 15 18    Complications: No apparent anesthesia complications

## 2016-01-15 NOTE — Progress Notes (Signed)
eLink Physician-Brief Progress Note Patient Name: Hayley DienerMary A Roth DOB: 03-31-1927 MRN: [id number]   Date of Service  01/15/2016  HPI/Events of Note  Nurse reports relative hypotension even after when necessary IV Versed. Goal systolic blood pressure 120-140.  eICU Interventions  Neo-Synephrine IV when necessary to maintain systolic blood pressure within goal parameters set by interventional radiology     Intervention Category Major Interventions: Hypotension - evaluation and management  Lawanda CousinsJennings Yacqub Baston 01/15/2016, 6:04 AM

## 2016-01-15 NOTE — Anesthesia Postprocedure Evaluation (Signed)
Anesthesia Post Note  Patient: Hayley Roth  Procedure(s) Performed: Procedure(s) (LRB): RADIOLOGY WITH ANESTHESIA (N/A)  Patient location during evaluation: SICU Anesthesia Type: General Level of consciousness: sedated Pain management: pain level controlled Vital Signs Assessment: post-procedure vital signs reviewed and stable Respiratory status: patient remains intubated per anesthesia plan Cardiovascular status: stable Anesthetic complications: no    Last Vitals:  Filed Vitals:   01/14/16 2045 01/14/16 2100  BP: 169/101 163/91  Pulse: 72 74  Temp:    Resp: 15 18    Last Pain:  Filed Vitals:   01/14/16 2106  PainSc: 0-No pain                 Gilberto Streck,W. EDMOND

## 2016-01-15 NOTE — Progress Notes (Signed)
OT Cancellation Note  Patient Details Name: Naoma DienerMary A Kahan MRN: [id number] DOB: [birth year]   Cancelled Treatment:    Reason Eval/Treat Not Completed: Other (comment). Pt currently on bedrest. Will attempt OT eval at a later time.  Evette GeorgesLeonard, Katerin Negrete Eva [phone] 01/15/2016, 7:30 AM

## 2016-01-15 NOTE — Progress Notes (Signed)
R 9 french sheath removed using 7 french exoseal under sterile conditions at 1550.

## 2016-01-15 NOTE — Progress Notes (Signed)
STROKE TEAM PROGRESS NOTE   HISTORY OF PRESENT ILLNESS Hayley Roth is a 82 y.o. female with a hx of untreated afib who lives at an assisted living facility and is able to walk, speak and go about her life at baseline. Family tell me that she might have a small degree of dementia. Today at 5:30 while eating rather suddenly stopped moving the left side of her body. She came in as a code stroke and I examined her in the scanner but was unable to give tPA because head CT showed a subacute stroke with petechia. CTA showed right M1 occlusion and I communicated findings immediately to interventional. Spoke with family and they agreed with IR procedure. Will is power of attorney [phone].  LKW: 5:30PM tpa given?: NO 2/2 hypodensity, new with petechia Premorbid modified rankin scale: 1 ICH Score: 1 NIHSS 21   SUBJECTIVE (INTERVAL HISTORY) Her husband and son are at the bedside.  Overall she feels her condition is unchanged. Blood pressure has been adequately controlled. She remains neurologically unresponsive and not following commands. She localizes left more than right side to pain.   OBJECTIVE Temp:  [95.8 F (35.4 C)-98.4 F (36.9 C)] 95.8 F (35.4 C) (03/20 0345) Pulse Rate:  [55-135] 119 (03/20 0645) Cardiac Rhythm:  [-] Atrial fibrillation (03/20 0130) Resp:  [15-26] 21 (03/20 0645) BP: (90-169)/(53-103) 102/64 mmHg (03/20 0645) SpO2:  [93 %-100 %] 99 % (03/20 0645) Arterial Line BP: (81-148)/(61-108) 106/72 mmHg (03/20 0645) FiO2 (%):  [40 %] 40 % (03/20 0305) Weight:  [70.3 kg (154 lb 15.7 oz)] 70.3 kg (154 lb 15.7 oz) (03/19 1913)  CBC:  Recent Labs Lab 01/14/16 1920 01/14/16 1921 01/15/16 0450  WBC 8.6  --  19.5*  NEUTROABS 4.5  --  14.6*  HGB 13.8 15.6* 12.5  HCT 41.5 46.0 37.9  MCV 90.6  --  90.9  PLT 267  --  280    Basic Metabolic Panel:  Recent Labs Lab 01/14/16 1920 01/14/16 1921 01/15/16 0450  NA 135 132* 136  K 4.0 4.1 3.1*  CL 96* 98* 102   CO2 25  --  21*  GLUCOSE 137* 138* 236*  BUN CREATININE 0.98 0.90 0.81  CALCIUM 9.5  --  8.1*    Lipid Panel:    Component Value Date/Time   CHOL 201* 01/15/2016 0450   TRIG 177* 01/15/2016 0450   HDL 24* 01/15/2016 0450   CHOLHDL 8.4 01/15/2016 0450   VLDL 35 01/15/2016 0450   LDLCALC 142* 01/15/2016 0450   HgbA1c: No results found for: HGBA1C Urine Drug Screen:    Component Value Date/Time   LABOPIA NONE DETECTED 01/14/2016 2106   COCAINSCRNUR NONE DETECTED 01/14/2016 2106   LABBENZ NONE DETECTED 01/14/2016 2106   AMPHETMU NONE DETECTED 01/14/2016 2106   THCU NONE DETECTED 01/14/2016 2106   LABBARB NONE DETECTED 01/14/2016 2106      IMAGING  Ct Angio Head W/cm &/or Wo Cm 01/14/2016   1. Right M1 occlusion with infarct is seen in the insula. Large collaterals over the right parietal convexity.  2. Subacute left parietal infarct with petechial hemorrhage.  3. Fibromuscular dysplasia seen in the cervical ICAs.    Ct Angio Neck W/cm &/or Wo/cm 01/14/2016   1. Right M1 occlusion with infarct is seen in the insula. Large collaterals over the right parietal convexity.  2. Subacute left parietal infarct with petechial hemorrhage.  3. Fibromuscular dysplasia seen in the cervical ICAs.  Ct Head Wo Contrast 01/14/2016   Changes consistent with acute to subacute ischemia in the posterior distribution of the left MCA. An old infarct is noted in the anterior aspect of the left MCA distribution.     Dg Chest Port 1 View 01/15/2016   Endotracheal tube with tip measuring 4.3 cm above the carina. Shallow inspiration. Lungs clear.    Ct Head Code Stroke W/o Cm 01/15/2016   Acute intraparenchymal hemorrhage in the right basal ganglia extending up into the centrum semiovale. Mildly increased edema and mass effect associated with the known left parietal hemorrhage.   CEREBRAL ANGIOGRAM [ZOX0960 (Custom)]      Expand All Collapse All   S/P rt vert arteriogram,and RT  common carotid arteriogram,followe dpartial revascularization of occluded RT MCA M1 seg after x3 passes with the solitaire 40 mm retriever ,x 1 pass with the trevoprovue 4mm x 30 mm retriever,x 1pass with the penumbra suction device and x 2 balloon angioplasty          PHYSICAL EXAM Elderly Caucasian lady who is intubated and sedated. . Afebrile. Head is nontraumatic. Neck is supple without bruit.    Cardiac exam no murmur or gallop. Lungs are clear to auscultation. Distal pulses are well felt. She has bilateral arterial groin sheaths Neurological Exam :  Sedated on propofol. Stuporous but partially opens eyes to sternal rub. Right gaze preference. Not able to look to the left past midline. Pupils equal reactive. Fundi were not visualized. Left lower facial weakness. Tongue midline. Moves right upper and lower extremities spontaneously and briskly to sternal rub. Left upper extremity is flaccid and does not move. Able to move the left lower extremity against gravity to sternal rub. Left plantar upgoing right downgoing.     ASSESSMENT/PLAN Hayley Roth is a 82 y.o. female with history of Atrial fibrillation not anticoagulated and mild dementia presenting with left hemiparesis.. She did not receive IV t-PA due to petechiae on head CT. Status post thrombectomy  Stroke:  Non-dominant acute intraparenchymal hemorrhage in the right basal ganglia s/p mechanical embolectomy for right M 1 occlusion  Resultant comatose state and Left hemiplegia   MRI - not performed  MRA  not performed  Carotid Doppler refer to CTA of the neck  2D Echo EF 45-50%. No definite cardiac source of emboli identified.  LDL 142  HgbA1c pending  VTE prophylaxis - SCDs  Diet NPO time specified  No antithrombotic prior to admission, now on No antithrombotic secondary to hemorrhage  Patient counseled to be compliant with her antithrombotic medications  Ongoing aggressive stroke risk factor  management  Therapy recommendations: Pending  Disposition: Pending  Hypertension  Stable  Permissive hypertension (OK if < 220/120) but gradually normalize in 5-7 days  Hyperlipidemia  Home meds:  No lipid lowering medications prior to admission  LDL 142, goal < 70  Add Lipitor 40 mg daily  Continue statin at discharge    Other Stroke Risk Factors  Advanced age  Obesity, Body mass index is 30.27 kg/(m^2).   Hx stroke/TIA - remote infarct noted on CT  Atrial fibrillation  Other Active Problems  Leukocytosis - afebrile - on vancomycin  Hypokalemia - supplemented  Repeat labs in a.m.  Hospital day # 1  I have personally examined this patient, reviewed notes, independently viewed imaging studies, participated in medical decision making and plan of care. I have made any additions or clarifications directly to the above note. Agree with note above. She presented with left  hemiplegia secondary to right middle cerebral artery occlusion and was treated with mechanical embolectomy which was technically difficult and was not fully revascularized despite multiple attempts and developed post revascularization hematoma in the right basal ganglia. She remains at risk for neurological worsening, development of hydrocephalus, cerebral edema, recurrent stroke, hemorrhage expansion and requires critical care, and close neurological monitoring and blood pressure management. I had a long discussion with the patient, husband and son regarding her prognosis which appears quite guarded. Family would like to support her for a few days but clearly feels that they would not want prolonged ventilatory support, tracheostomy, PEG tube. The family agreed to DO NOT RESUSCITATE  This patient is critically ill and at significant risk of neurological worsening, death and care requires constant monitoring of vital signs, hemodynamics,respiratory and cardiac monitoring, extensive review of multiple databases,  frequent neurological assessment, discussion with family, other specialists and medical decision making of high complexity.I have made any additions or clarifications directly to the above note.This critical care time does not reflect procedure time, or teaching time or supervisory time of PA/NP/Med Resident etc but could involve care discussion time.  I spent 50 minutes of neurocritical care time  in the care of  this patient.     Delia HeadyPramod Embree Brawley, MD Medical Director [facility] Pager: [phone] 01/15/2016 5:33 PM     To contact Stroke Continuity provider, please refer to [email or web address]. After hours, contact General Neurology

## 2016-01-15 NOTE — Procedures (Signed)
S/P rt vert arteriogram,and RT common carotid arteriogram,followe dpartial revascularization of occluded RT MCA M1 seg after x3 passes with the solitaire 40 mm retriever ,x 1 pass with the trevoprovue 4mm x 30 mm retriever,x 1pass with the penumbra suction device and x 2 balloon angioplasty

## 2016-01-15 NOTE — Progress Notes (Signed)
Hemostasis to bilateral groins 1515. dsg intact. Pedal DP pulses 2t

## 2016-01-15 NOTE — Progress Notes (Signed)
L  FA 4 french sheath pulled and pressure held, Vpad in place

## 2016-01-15 NOTE — Progress Notes (Signed)
eLink Physician-Brief Progress Note Patient Name: Naoma DienerMary A Dohrman DOB: 1927-04-04 MRN: [id number]   Date of Service  01/15/2016  HPI/Events of Note  Notified by bedside nurse of need for sedation on mechanical ventilation. Blood pressure borderline.  eICU Interventions  Versed IV when necessary     Intervention Category Intermediate Interventions: Other:  Lawanda CousinsJennings Solina Heron 01/15/2016, 5:09 AM

## 2016-01-15 NOTE — Progress Notes (Signed)
PT Cancellation Note  Patient Details Name: Hayley Roth MRN: [id number] DOB: 04-30-1927   Cancelled Treatment:    Reason Eval/Treat Not Completed: Patient not medically ready.  Order for Bedrest and to "Lay flat with both legs straight until extubation".  Will hold PT and mobility at this time and f/u as appropriate.     Sunny SchleinRitenour, Rylah Fukuda F, South CarolinaPT [phone] 01/15/2016, 8:43 AM

## 2016-01-15 NOTE — Progress Notes (Signed)
  Echocardiogram 2D Echocardiogram has been performed.  Hayley Roth, Hayley Roth R 01/15/2016, 9:46 AM

## 2016-01-16 ENCOUNTER — Inpatient Hospital Stay (HOSPITAL_COMMUNITY): Payer: Commercial Managed Care - HMO

## 2016-01-16 ENCOUNTER — Ambulatory Visit
Admission: RE | Admit: 2016-01-16 | Payer: Commercial Managed Care - HMO | Source: Ambulatory Visit | Admitting: Orthopedic Surgery

## 2016-01-16 ENCOUNTER — Encounter: Admission: RE | Payer: Self-pay | Source: Ambulatory Visit

## 2016-01-16 DIAGNOSIS — J9620 Acute and chronic respiratory failure, unspecified whether with hypoxia or hypercapnia: Secondary | ICD-10-CM

## 2016-01-16 DIAGNOSIS — I63412 Cerebral infarction due to embolism of left middle cerebral artery: Secondary | ICD-10-CM

## 2016-01-16 LAB — BASIC METABOLIC PANEL
ANION GAP: 9 (ref 5–15)
BUN: <5 mg/dL — ABNORMAL LOW (ref 6–20)
CHLORIDE: 101 mmol/L (ref 101–111)
CO2: 28 mmol/L (ref 22–32)
CREATININE: 0.89 mg/dL (ref 0.44–1.00)
Calcium: 8.2 mg/dL — ABNORMAL LOW (ref 8.9–10.3)
GFR calc non Af Amer: 57 mL/min — ABNORMAL LOW (ref >60)
Glucose, Bld: 115 mg/dL — ABNORMAL HIGH (ref 65–99)
POTASSIUM: 3.9 mmol/L (ref 3.5–5.1)
SODIUM: 138 mmol/L (ref 135–145)

## 2016-01-16 LAB — HEMOGLOBIN A1C
Hgb A1c MFr Bld: 6.2 % — ABNORMAL HIGH (ref 4.8–5.6)
MEAN PLASMA GLUCOSE: 131 mg/dL

## 2016-01-16 LAB — CBC
HEMATOCRIT: 38.1 % (ref 36.0–46.0)
HEMOGLOBIN: 13.2 g/dL (ref 12.0–15.0)
MCH: 33.2 pg (ref 26.0–34.0)
MCHC: 34.6 g/dL (ref 30.0–36.0)
MCV: 95.7 fL (ref 78.0–100.0)
PLATELETS: 130 10*3/uL — AB (ref 150–400)
RBC: 3.98 MIL/uL (ref 3.87–5.11)
RDW: 13 % (ref 11.5–15.5)
WBC: 5.2 10*3/uL (ref 4.0–10.5)

## 2016-01-16 LAB — GLUCOSE, CAPILLARY
Glucose-Capillary: 134 mg/dL — ABNORMAL HIGH (ref 65–99)
Glucose-Capillary: 138 mg/dL — ABNORMAL HIGH (ref 65–99)

## 2016-01-16 SURGERY — KYPHOPLASTY
Anesthesia: Choice

## 2016-01-16 MED ORDER — LORAZEPAM 2 MG/ML IJ SOLN
INTRAMUSCULAR | Status: AC
  Administered 2016-01-16: 1 mg via INTRAVENOUS
  Filled 2016-01-16: qty 1

## 2016-01-16 MED ORDER — METOPROLOL TARTRATE 1 MG/ML IV SOLN
5.0000 mg | Freq: Four times a day (QID) | INTRAVENOUS | Status: DC
  Administered 2016-01-16 – 2016-01-17 (×4): 5 mg via INTRAVENOUS
  Filled 2016-01-16 (×4): qty 5

## 2016-01-16 MED ORDER — LORAZEPAM 2 MG/ML IJ SOLN
INTRAMUSCULAR | Status: AC
  Filled 2016-01-16: qty 1

## 2016-01-16 MED ORDER — LABETALOL HCL 5 MG/ML IV SOLN: 10.0000 mg | INTRAVENOUS | Status: DC | PRN

## 2016-01-16 MED ORDER — LORAZEPAM 2 MG/ML IJ SOLN
1.0000 mg | Freq: Once | INTRAMUSCULAR | Status: AC
  Administered 2016-01-16: 1 mg via INTRAVENOUS

## 2016-01-16 NOTE — Progress Notes (Signed)
PULMONARY / CRITICAL CARE MEDICINE   Name: Hayley DienerMary A Roth MRN: [id number] DOB: 1926/11/19    ADMISSION DATE:  01/14/2016 CONSULTATION DATE:  01/14/16  REFERRING MD:  Saralyn PilarVega, J  CHIEF COMPLAINT:  Ischemic stroke  HISTORY OF PRESENT ILLNESS:   Ms. Hayley Roth is an [age]F with history of atrial fibrillation who presented to the ED as a code stroke after acute onset of left sided weakness, facial droop and slurred speech. She was initially hypertensive. Initial stroke scale 25. CT and CTA demonstrated right M1 occlusion. No tPA was given 2/2 findings of subacute stroke with petechiae. IR was consulted and took the patient for angiography with partial embolectomy; this procedure was not successful, however. She was intubated for the case and brought to the ICU on a ventilator and on propofol with paralytics still on board.   Per the notes, prior to this event she was able to live fairly independently in an assisted living facility with perhaps some very mild dementia. Her family confirms this. They state that she does have a living will and was quite clear that she would not want to be on prolonged life support. They think she would have wanted to be DNR in this situation.     SUBJECTIVE:  Afebrile No obvious pain On cardene Daughter at bedside   VITAL SIGNS: BP 153/99 mmHg  Pulse 97  Temp(Src) 99.4 F (37.4 C) (Axillary)  Resp 26  Ht 5' (1.524 m)  Wt 154 lb 15.7 oz (70.3 kg)  BMI 30.27 kg/m2  SpO2 98%  HEMODYNAMICS:    VENTILATOR SETTINGS: Vent Mode:  [-] PSV;CPAP FiO2 (%):  [40 %] 40 % Set Rate:  [18 bmp] 18 bmp Vt Set:  [370 mL] 370 mL PEEP:  [5 cmH20] 5 cmH20 Pressure Support:  [10 cmH20] 10 cmH20 Plateau Pressure:  [10 cmH20-14 cmH20] 14 cmH20  INTAKE / OUTPUT: I/O last 3 completed shifts: In: 3395 [I.V.:3195; IV Piggyback:200] Out: 3700 [Urine:3600; Blood:100]  PHYSICAL EXAMINATION:  General Well nourished, well developed, intubated, sedated  HEENT No gross  abnormalities. ETT in place.   Pulmonary Coarse breath sounds bilaterally with no wheezes, rales or ronchi.    Cardiovascular Normal rate, irregularly irregular rhythm. S1, s2. No m/r/g. Distal pulses palpable.  Abdomen Soft, non-tender, non-distended, positive bowel sounds, no palpable organomegaly or masses.   Musculoskeletal No bony abnormalities.  Lymphatics No cervical, supraclavicular or axillary adenopathy.   Neurologic Pupils equal (4mm), round, reactive. puposeful on rt, hemiplegic on left   Skin/Integuement No rash, no cyanosis, no clubbing. Venous stasis changes bilateral lower extremities R > L.      LABS:  BMET  Recent Labs Lab 01/14/16 1920 01/14/16 1921 01/15/16 0450 01/16/16 0629  NA 135 132* 136 138  K 4.0 4.1 3.1* 3.9  CL 96* 98* 102 101  CO2 25  --  21* 28  BUN 12 17 8  <5*  CREATININE 0.98 0.90 0.81 0.89  GLUCOSE 137* 138* 236* 115*    Electrolytes  Recent Labs Lab 01/14/16 1920 01/15/16 0450 01/16/16 0629  CALCIUM 9.5 8.1* 8.2*    CBC  Recent Labs Lab 01/14/16 1920 01/14/16 1921 01/15/16 0450 01/16/16 0629  WBC 8.6  --  19.5* 5.2  HGB 13.8 15.6* 12.5 13.2  HCT 41.5 46.0 37.9 38.1  PLT 267  --  280 130*    Coag's  Recent Labs Lab 01/14/16 1920  APTT 28  INR 1.22    Sepsis Markers  Recent Labs Lab 01/14/16 1921  LATICACIDVEN  1.40    ABG  Recent Labs Lab 01/15/16 0226  PHART 7.380  PCO2ART 38.4  PO2ART 86.0    Liver Enzymes  Recent Labs Lab 01/14/16 1920  AST 23  ALT 14  ALKPHOS 98  BILITOT 0.8  ALBUMIN 3.3*    Cardiac Enzymes No results for input(s): TROPONINI, PROBNP in the last 168 hours.  Glucose  Recent Labs Lab 01/15/16 1955 01/15/16 2334 01/16/16 0326  GLUCAP 124* 138* 134*    Imaging Ct Head Wo Contrast  01/16/2016  CLINICAL DATA:  Follow-up examination for intra cerebral hemorrhage. EXAM: CT HEAD WITHOUT CONTRAST TECHNIQUE: Contiguous axial images were obtained from the base of the  skull through the vertex without intravenous contrast. COMPARISON:  Prior studies from 01/15/2016 as well as 01/14/2016 FINDINGS: Continue normal expected interval evolution of subacute appearing posterior left MCA territory infarct, slightly less dense in appearance relative to previous study, which likely reflects less contrast staining due to recent angiogram on the previous exam. Previously noted hyperdensity within the right lentiform nucleus extending towards the right basal ganglia is markedly reduced, likely reflecting decrease contrast staining, although a small amount of hemorrhage may be present. There is an evolving large acute right MCA territory infarct involving the anterior right MCA division, primarily the insular region and operculum. Likely scattered petechial hemorrhage within this region. Infarct involves the right lentiform nucleus with relative sparing of the caudate. Increased mass effect with partial effacement of the right lateral ventricle. New 4 mm right-to-left shift without evidence for ventricular trapping. Basilar cisterns remain patent. Additional remote left frontal infarct and right PCA territory infarcts again noted. No extra-axial fluid collection. Scalp soft tissues within normal limits. No acute abnormality about the orbits. Paranasal sinuses are clear.  No mastoid effusion. Calvarium unchanged. IMPRESSION: 1. Interval evolution of large right MCA territory infarct with increased mass effect within the right cerebral hemisphere and new 4 mm of right to left shift. Probable scattered petechial hemorrhage within the area of infarction. 2. Decreased hyperdensity within the right basal ganglia, likely due to decreased contrast staining from prior angiogram. A small amount of hemorrhage is still suspected. No intraventricular extension. 3. Continued interval evolution of subacute left MCA territory infarct with associated petechial hemorrhage. These results will be called to the  ordering clinician or representative by the Radiologist Assistant, and communication documented in the PACS or zVision Dashboard. Electronically Signed   By: Rise Mu M.D.   On: 01/16/2016 07:21     STUDIES:  Head CT 3/21 >> evolution of large right MCA territory infarct , 4mm shift BG small bleed, subacute left MCA territory infarct    CULTURES: None  ANTIBIOTICS: None  SIGNIFICANT EVENTS:   LINES/TUBES: PIV ETT 3/19  DISCUSSION: Ms. Coulson is an 34F with M1 ischemic stroke s/p failed attempt at revascularization and not a tPA candidate. Her deficits in the ED were significant dysarthria and L sided weakness. Her family (3 of 6 children) understand that her prognosis is guarded and have changed her code status to DNR.  She has a history of atrial fibrillation, not previously on anticoagulation.   ASSESSMENT / PLAN:  PULMONARY A: Need for mechanical ventilation secondary to recent procedures and mental status P:   Tolerates SBTs but no extubation  CARDIOVASCULAR A:  Paroxysmal atrial fibrillation Hypertension P:  Not  a candidate for anticoagulation Home meds  Held - use lopressor 5 q6 & labetalol prn Taper off cardene  RENAL A:   hypokalemia P:  Monitor renal function / UOP replet lytes as needed  GASTROINTESTINAL A:   No acute issues P:   NPO  HEMATOLOGIC A:   No acute issues P:  Monitor  INFECTIOUS A:   No acute issues P:   Monitor  ENDOCRINE A:   No acute issues P:   Monitor  NEUROLOGIC A:   M1 territory ischemic stroke Subacute stroke w/ petechiae P:   RASS goal: 0  Neurology following  BP goal 120-140 systolic Fent as needed for sedation    FAMILY  - Updates: 3 children (including POA, Will) at bedside by dr Pearlean Brownie, one way extubation eventually when family ready  - Inter-disciplinary family meet or Palliative Care meeting due by:  day 7  The patient is critically ill with multiple organ system failure and  requires high complexity decision making for assessment and support, frequent evaluation and titration of therapies, advanced monitoring, review of radiographic studies and interpretation of complex data.   Critical Care Time devoted to patient care services, exclusive of separately billable procedures, described in this note is 31 minutes.     01/16/2016, 10:17 AM

## 2016-01-16 NOTE — Progress Notes (Signed)
PT Cancellation Note  Patient Details Name: Hayley DienerMary A Roth MRN: [id number] DOB: 10/05/1927   Cancelled Treatment:    Reason Eval/Treat Not Completed: Patient not medically ready.  Pt remains intubated w/ order for Bedrest and to "Lay flat with both legs straight until extubation".  PT will continue to follow acutely.  Encarnacion ChuAshley Abashian PT, DPT  Pager: [phone] Phone: [phone] 01/16/2016, 8:42 AM

## 2016-01-16 NOTE — H&P (Signed)
Patient had facial twitching that lasted one minute.  Patient was able to follow commands including sticking tongue out and wiggle toes during facial twitching.  Dr. Pearlean BrownieSethi notified.  While on the phone the twitching stopped.  He advised if it happens again to give 1mg  of Ativan IV.

## 2016-01-16 NOTE — Progress Notes (Signed)
Pt had another episode of facial twitching.  Gave 1mg of Ativan IV one time order 

## 2016-01-16 NOTE — Progress Notes (Addendum)
STROKE TEAM PROGRESS NOTE   HISTORY OF PRESENT ILLNESS Hayley Roth is a 82 y.o. female with a hx of untreated afib who lives at an assisted living facility and is able to walk, speak and go about her life at baseline. Family tell me that she might have a small degree of dementia. Today at 5:30 while eating rather suddenly stopped moving the left side of her body. She came in as a code stroke and I examined her in the scanner but was unable to give tPA because head CT showed a subacute stroke with petechia. CTA showed right M1 occlusion and I communicated findings immediately to interventional. Spoke with family and they agreed with IR procedure. Will is power of attorney [phone].  LKW: 5:30PM tpa given?: NO 2/2 hypodensity, new with petechia Premorbid modified rankin scale: 1 ICH Score: 1 NIHSS 21   SUBJECTIVE (INTERVAL HISTORY) Her husband and son are at the bedside.  Overall she feels her condition is unchanged. Blood pressure has been adequately controlled. She remains neurologically unresponsive and  following  only occasional commands. She localizes left more than right side to pain. Repeat CT scan of the head shows significant resolution of the right basal ganglia hyperdensity indicating contrast rather than hematoma but presence of large right MCA infarct with cytotoxic edema and right-to-left midline shift of 4 mm. She is having some intermittent facial twitchings but will not treated aggressively as patient is thinking about comfort care  OBJECTIVE Temp:  [97.5 F (36.4 C)-99.5 F (37.5 C)] 99.5 F (37.5 C) (03/21 1200) Pulse Rate:  [58-104] 79 (03/21 1400) Cardiac Rhythm:  [-] Normal sinus rhythm (03/21 0800) Resp:  [17-26] 20 (03/21 1400) BP: (111-171)/(52-99) 130/66 mmHg (03/21 1400) SpO2:  [97 %-100 %] 100 % (03/21 1400) Arterial Line BP: (144)/(140) 144/140 mmHg (03/20 1500) FiO2 (%):  [40 %] 40 % (03/21 1245)  CBC:  Recent Labs Lab 01/14/16 1920   01/15/16 0450 01/16/16 0629  WBC 8.6  --  19.5* 5.2  NEUTROABS 4.5  --  14.6*  --   HGB 13.8  < > 12.5 13.2  HCT 41.5  < > 37.9 38.1  MCV 90.6  --  90.9 95.7  PLT 267  --  280 130*  < > = values in this interval not displayed.  Basic Metabolic Panel:   Recent Labs Lab 01/15/16 0450 01/16/16 0629  NA 136 138  K 3.1* 3.9  CL 102 101  CO2 21* 28  GLUCOSE 236* 115*  BUN 8 <5*  CREATININE 0.81 0.89  CALCIUM 8.1* 8.2*    Lipid Panel:     Component Value Date/Time   CHOL 201* 01/15/2016 0450   TRIG 177* 01/15/2016 0450   HDL 24* 01/15/2016 0450   CHOLHDL 8.4 01/15/2016 0450   VLDL 35 01/15/2016 0450   LDLCALC 142* 01/15/2016 0450   HgbA1c:  Lab Results  Component Value Date   HGBA1C 6.2* 01/15/2016   Urine Drug Screen:     Component Value Date/Time   LABOPIA NONE DETECTED 01/14/2016 2106   COCAINSCRNUR NONE DETECTED 01/14/2016 2106   LABBENZ NONE DETECTED 01/14/2016 2106   AMPHETMU NONE DETECTED 01/14/2016 2106   THCU NONE DETECTED 01/14/2016 2106   LABBARB NONE DETECTED 01/14/2016 2106      IMAGING  Ct Angio Head W/cm &/or Wo Cm 01/14/2016   1. Right M1 occlusion with infarct is seen in the insula. Large collaterals over the right parietal convexity.  2. Subacute left parietal infarct with  petechial hemorrhage.  3. Fibromuscular dysplasia seen in the cervical ICAs.    Ct Angio Neck W/cm &/or Wo/cm 01/14/2016   1. Right M1 occlusion with infarct is seen in the insula. Large collaterals over the right parietal convexity.  2. Subacute left parietal infarct with petechial hemorrhage.  3. Fibromuscular dysplasia seen in the cervical ICAs.    Ct Head Wo Contrast 01/14/2016   Changes consistent with acute to subacute ischemia in the posterior distribution of the left MCA. An old infarct is noted in the anterior aspect of the left MCA distribution.     Dg Chest Port 1 View 01/15/2016   Endotracheal tube with tip measuring 4.3 cm above the carina. Shallow  inspiration. Lungs clear.    Ct Head Code Stroke W/o Cm 01/15/2016   Acute intraparenchymal hemorrhage in the right basal ganglia extending up into the centrum semiovale. Mildly increased edema and mass effect associated with the known left parietal hemorrhage.   CEREBRAL ANGIOGRAM [[id number] (Custom)]      Expand All Collapse All   S/P rt vert arteriogram,and RT common carotid arteriogram,followe dpartial revascularization of occluded RT MCA M1 seg after x3 passes with the solitaire 4mmx 40 mm retriever ,x 1 pass with the trevoprovue 4mm x 30 mm retriever,x 1pass with the penumbra suction device and x 2 balloon angioplasty          PHYSICAL EXAM Elderly Caucasian lady who is intubated and sedated. . Afebrile. Head is nontraumatic. Neck is supple without bruit.    Cardiac exam no murmur or gallop. Lungs are clear to auscultation. Distal pulses are well felt. She has bilateral arterial groin sheaths Neurological Exam :   . Stuporous but partially opens eyes to sternal rub. Right gaze preference. Not able to look to the left past midline. Pupils equal reactive. Fundi were not visualized. Left lower facial weakness. Tongue midline. Moves right upper and lower extremities spontaneously and briskly to sternal rub. Left upper extremity is flaccid and does not move. Able to move the left lower extremity against gravity to sternal rub. Left plantar upgoing right downgoing.Intermittent facial twitchings noted on the left     ASSESSMENT/PLAN Ms. Hayley DienerMary A Roth is a 82 y.o. female with history of Atrial fibrillation not anticoagulated and mild dementia presenting with left hemiparesis.. She did not receive IV t-PA due to petechiae on head CT. Status post thrombectomy  Stroke:  Non-dominant acute intraparenchymal hemorrhage in the right basal ganglia s/p mechanical embolectomy for right M 1 occlusion  Resultant comatose state and Left hemiplegia   MRI - not performed  MRA  not  performed  Carotid Doppler refer to CTA of the neck  2D Echo EF 45-50%. No definite cardiac source of emboli identified.  LDL 142  HgbA1c 6.2  VTE prophylaxis - SCDs Diet NPO time specified  No antithrombotic prior to admission, now on No antithrombotic secondary to hemorrhage  Patient counseled to be compliant with her antithrombotic medications  Ongoing aggressive stroke risk factor management  Therapy recommendations: Pending  Disposition: Pending  Hypertension  Stable  Permissive hypertension (OK if < 220/120) but gradually normalize in 5-7 days  Hyperlipidemia  Home meds:  No lipid lowering medications prior to admission  LDL 142, goal < 70  Add Lipitor 40 mg daily  Continue statin at discharge    Other Stroke Risk Factors  Advanced age  Obesity, Body mass index is 30.27 kg/(m^2).   Hx stroke/TIA - remote infarct noted on CT  Atrial fibrillation  Other Active Problems  Leukocytosis - afebrile - on vancomycin  Hypokalemia - supplemented  Repeat labs in a.m.  Hospital day # 2  I have personally examined this patient, reviewed notes, independently viewed imaging studies, participated in medical decision making and plan of care. I have made any additions or clarifications directly to the above note. Agree with note above. She presented with left hemiplegia secondary to right middle cerebral artery occlusion and was treated with mechanical embolectomy which was technically difficult and was not fully revascularized despite multiple attempts and developed post revascularization hematoma in the right basal ganglia. She remains at risk for neurological worsening, development of hydrocephalus, cerebral edema, recurrent stroke, hemorrhage expansion and requires critical care, and close neurological monitoring and blood pressure management. I had a long discussion with the patient, husband and son regarding her prognosis which appears quite guarded. Family would  like to support her for a few days but clearly feels that they would not want prolonged ventilatory support, tracheostomy, PEG tube. The family agreed to DO NOT RESUSCITATE . I explained to the family that the patient may need hypertonic saline to control cerebral edema. They want time to discuss amongst themselves whether they want to lean towards comfort care and withdrawal and they will decide and let me know This patient is critically ill and at significant risk of neurological worsening, death and care requires constant monitoring of vital signs, hemodynamics,respiratory and cardiac monitoring, extensive review of multiple databases, frequent neurological assessment, discussion with family, other specialists and medical decision making of high complexity.I have made any additions or clarifications directly to the above note.This critical care time does not reflect procedure time, or teaching time or supervisory time of PA/NP/Med Resident etc but could involve care discussion time.  I spent 40 minutes of neurocritical care time  in the care of  this patient.     Delia Heady, MD Medical Director [facility] Pager: [phone] 01/16/2016 2:38 PM     To contact Stroke Continuity provider, please refer to [email or web address]. After hours, contact General Neurology

## 2016-01-16 NOTE — Progress Notes (Deleted)
Pt had another episode of facial twitching.  Gave 1mg  of Ativan IV one time order

## 2016-01-16 NOTE — Progress Notes (Signed)
OT Cancellation Note  Patient Details Name: Naoma DienerMary A Salmela MRN: [id number] DOB: 10/19/1927   Cancelled Treatment:    Reason Eval/Treat Not Completed: Patient not medically ready  Angelene GiovanniConarpe, Royer Cristobal M  Masao Junker [place]onarpe, OTR/L [phone]  01/16/2016, 2:45 PM

## 2016-01-16 NOTE — Progress Notes (Signed)
Supervising Physician: Dr. Nicki Reaperevshwar  Chief Complaint: (R)MCA infarct s/p revasc  Subjective: Pt remains intubated. PRN sedation but not much activity. Repeat CT last pm Sheaths out yesterday  Allergies: Iodinated diagnostic agents; Penicillins; and Sulfa antibiotics  Medications:  Current facility-administered medications:  .   stroke: mapping our early stages of recovery book, , Does not apply, Once, Heron SabinsJose Vega, MD .  0.9 %  sodium chloride infusion, , Intravenous, Continuous, Heron SabinsJose Vega, MD .  0.9 %  sodium chloride infusion, , Intravenous, Continuous, Julieanne CottonSanjeev Deveshwar, MD, Last Rate: 75 mL/hr at 01/15/16 0800 .  acetaminophen (TYLENOL) tablet 650 mg, 650 mg, Oral, Q4H PRN **OR** acetaminophen (TYLENOL) suppository 650 mg, 650 mg, Rectal, Q4H PRN, Heron SabinsJose Vega, MD .  acetaminophen (TYLENOL) tablet 1,000 mg, 1,000 mg, Oral, Q6H PRN **OR** acetaminophen (TYLENOL) suppository 650 mg, 650 mg, Rectal, Q6H PRN, Julieanne CottonSanjeev Deveshwar, MD .  antiseptic oral rinse solution (CORINZ), 7 mL, Mouth Rinse, 10 times per day, Heron SabinsJose Vega, MD, 7 mL at 01/16/16 0500 .  atorvastatin (LIPITOR) tablet 40 mg, 40 mg, Oral, q1800, David L Rinehuls, PA-C, 40 mg at 01/15/16 1752 .  chlorhexidine gluconate (PERIDEX) 0.12 % solution 15 mL, 15 mL, Mouth Rinse, BID, Heron SabinsJose Vega, MD, 15 mL at 01/15/16 1953 .  fentaNYL (SUBLIMAZE) injection 25-50 mcg, 25-50 mcg, Intravenous, Q2H PRN, Cyril Mourningakesh Alva V, MD .  labetalol (NORMODYNE,TRANDATE) injection 10 mg, 10 mg, Intravenous, Q10 min PRN, Heron SabinsJose Vega, MD .  nicardipine (CARDENE) 20mg  in 0.86% saline 200ml IV infusion (0.1 mg/ml), 5-15 mg/hr, Intravenous, Continuous, Julieanne CottonSanjeev Deveshwar, MD, Last Rate: 25 mL/hr at 01/16/16 0621, 2.5 mg/hr at 01/16/16 0621 .  ondansetron (ZOFRAN) injection 4 mg, 4 mg, Intravenous, Q6H PRN, Julieanne CottonSanjeev Deveshwar, MD .  pantoprazole (PROTONIX) injection 40 mg, 40 mg, Intravenous, QHS, Heron SabinsJose Vega, MD, 40 mg at 01/15/16 2157 .  phenylephrine  (NEO-SYNEPHRINE) 10 mg in dextrose 5 % 250 mL (0.04 mg/mL) infusion, 0-100 mcg/min, Intravenous, Titrated, Roslynn AmbleJennings E Nestor, MD .  propofol (DIPRIVAN) 1000 MG/100ML infusion, 5-70 mcg/kg/min, Intravenous, Continuous, Orville GovernSarah Ellen Stephens, MD, Stopped at 01/15/16 [phone]    Vital Signs: BP 156/85 mmHg  Pulse 88  Temp(Src) 99.5 F (37.5 C) (Axillary)  Resp 17  Ht 5' (1.524 m)  Wt 154 lb 15.7 oz (70.3 kg)  BMI 30.27 kg/m2  SpO2 99%  Physical Exam Intubated Unresponsive Moving (R)UE, not on command Some movement of left arm when examining groin. Ext: Bilat groins soft, no hematoma   Imaging:   Ct Head Wo Contrast  01/16/2016  CLINICAL DATA:  Follow-up examination for intra cerebral hemorrhage. EXAM: CT HEAD WITHOUT CONTRAST TECHNIQUE: Contiguous axial images were obtained from the base of the skull through the vertex without intravenous contrast. COMPARISON:  Prior studies from 01/15/2016 as well as 01/14/2016 FINDINGS: Continue normal expected interval evolution of subacute appearing posterior left MCA territory infarct, slightly less dense in appearance relative to previous study, which likely reflects less contrast staining due to recent angiogram on the previous exam. Previously noted hyperdensity within the right lentiform nucleus extending towards the right basal ganglia is markedly reduced, likely reflecting decrease contrast staining, although a small amount of hemorrhage may be present. There is an evolving large acute right MCA territory infarct involving the anterior right MCA division, primarily the insular region and operculum. Likely scattered petechial hemorrhage within this region. Infarct involves the right lentiform nucleus with relative sparing of the caudate. Increased mass effect with partial effacement of the right lateral  ventricle. New 4 mm right-to-left shift without evidence for ventricular trapping. Basilar cisterns remain patent. Additional remote left frontal  infarct and right PCA territory infarcts again noted. No extra-axial fluid collection. Scalp soft tissues within normal limits. No acute abnormality about the orbits. Paranasal sinuses are clear.  No mastoid effusion. Calvarium unchanged. IMPRESSION: 1. Interval evolution of large right MCA territory infarct with increased mass effect within the right cerebral hemisphere and new 4 mm of right to left shift. Probable scattered petechial hemorrhage within the area of infarction. 2. Decreased hyperdensity within the right basal ganglia, likely due to decreased contrast staining from prior angiogram. A small amount of hemorrhage is still suspected. No intraventricular extension. 3. Continued interval evolution of subacute left MCA territory infarct with associated petechial hemorrhage. These results will be called to the ordering clinician or representative by the Radiologist Assistant, and communication documented in the PACS or zVision Dashboard. Electronically Signed   By: Rise Mu M.D.   On: 01/16/2016 07:21      Labs:  CBC:  Recent Labs  12/31/15 1845 01/14/16 1920 01/14/16 1921 01/15/16 0450 01/16/16 0629  WBC 10.1 8.6  --  19.5* 5.2  HGB 14.1 13.8 15.6* 12.5 13.2  HCT 42.1 41.5 46.0 37.9 38.1  PLT 256 267  --  280 130*    COAGS:  Recent Labs  01/14/16 1920  INR 1.22  APTT 28    BMP:  Recent Labs  12/31/15 1845 01/14/16 1920 01/14/16 1921 01/15/16 0450 01/16/16 0629  NA 136 135 132* 136 138  K 3.5 4.0 4.1 3.1* 3.9  CL 98* 96* 98* 102 101  CO2 27 25  --  21* 28  GLUCOSE 100* 137* 138* 236* 115*  BUN <5*  CALCIUM 9.2 9.5  --  8.1* 8.2*  CREATININE 1.03* 0.98 0.90 0.81 0.89  GFRNONAA 47* 50*  --  >60 57*  GFRAA 55* 58*  --  >60 >60    LIVER FUNCTION TESTS:  Recent Labs  12/31/15 1845 01/14/16 1920  BILITOT 0.9 0.8  AST 26 23  ALT 16 14  ALKPHOS 95 98  PROT 7.2 7.2  ALBUMIN 3.6 3.3*    Assessment and Plan: S/P rt vert  arteriogram,and RT common carotid arteriogram,followe dpartial revascularization of occluded RT MCA M1 seg after x3 passes with the solitaire 40 mm retriever ,x 1 pass with the trevoprovue 4mm x 30 mm retriever,x 1pass with the penumbra suction device and x 2 balloon angioplasty Remains on vent, neuro exam limited Will report to Dr. Corliss Skains  Electronically Signed: Brayton El 01/16/2016, 8:21 AM   I spent a total of 15 Minutes at the the patient's bedside AND on the patient's hospital floor or unit, greater than 50% of which was counseling/coordinating care for (R)MCA CVA

## 2016-01-17 DIAGNOSIS — D6489 Other specified anemias: Secondary | ICD-10-CM

## 2016-01-17 DIAGNOSIS — G936 Cerebral edema: Secondary | ICD-10-CM

## 2016-01-17 LAB — BASIC METABOLIC PANEL
ANION GAP: 7 (ref 5–15)
BUN: 6 mg/dL (ref 6–20)
CALCIUM: 8.5 mg/dL — AB (ref 8.9–10.3)
CO2: 25 mmol/L (ref 22–32)
Chloride: 105 mmol/L (ref 101–111)
Creatinine, Ser: 0.78 mg/dL (ref 0.44–1.00)
GLUCOSE: 124 mg/dL — AB (ref 65–99)
Potassium: 3.7 mmol/L (ref 3.5–5.1)
Sodium: 137 mmol/L (ref 135–145)

## 2016-01-17 LAB — CBC
HCT: 30.9 % — ABNORMAL LOW (ref 36.0–46.0)
HEMOGLOBIN: 9.7 g/dL — AB (ref 12.0–15.0)
MCH: 29.2 pg (ref 26.0–34.0)
MCHC: 31.4 g/dL (ref 30.0–36.0)
MCV: 93.1 fL (ref 78.0–100.0)
PLATELETS: 235 10*3/uL (ref 150–400)
RBC: 3.32 MIL/uL — ABNORMAL LOW (ref 3.87–5.11)
RDW: 13.5 % (ref 11.5–15.5)
WBC: 13.6 10*3/uL — ABNORMAL HIGH (ref 4.0–10.5)

## 2016-01-17 LAB — GLUCOSE, CAPILLARY: GLUCOSE-CAPILLARY: 118 mg/dL — AB (ref 65–99)

## 2016-01-17 MED ORDER — SODIUM CHLORIDE 0.9 % IV SOLN
1.0000 mg/h | INTRAVENOUS | Status: DC
  Administered 2016-01-17: 1 mg/h via INTRAVENOUS
  Filled 2016-01-17: qty 10

## 2016-01-17 MED ORDER — MORPHINE SULFATE 25 MG/ML IV SOLN
5.0000 mg/h | INTRAVENOUS | Status: DC
  Filled 2016-01-17: qty 10

## 2016-01-17 NOTE — Progress Notes (Signed)
PT Cancellation Note  Patient Details Name: Naoma DienerMary A Weidler MRN: [id number] DOB: 1927/04/09   Cancelled Treatment:    Reason Eval/Treat Not Completed: Patient not medically ready.  Pt remains on strict bedrest.  Will sign off PT and need new order once appropriate for PT and mobility.     Sunny SchleinRitenour, Skiler Tye F, [place] CarolinaPT [phone] 01/17/2016, 9:42 AM

## 2016-01-17 NOTE — Progress Notes (Signed)
STROKE TEAM PROGRESS NOTE   HISTORY OF PRESENT ILLNESS Hayley Roth is a 82 y.o. female with a hx of untreated afib who lives at an assisted living facility and is able to walk, speak and go about her life at baseline. Family tell me that she might have a small degree of dementia. Today at 5:30 while eating rather suddenly stopped moving the left side of her body. She came in as a code stroke and I examined her in the scanner but was unable to give tPA because head CT showed a subacute stroke with petechia. CTA showed right M1 occlusion and I communicated findings immediately to interventional. Spoke with family and they agreed with IR procedure. Will is power of attorney [phone].  LKW: 5:30PM tpa given?: NO 2/2 hypodensity, new with petechia Premorbid modified rankin scale: 1 ICH Score: 1 NIHSS 21   SUBJECTIVE (INTERVAL HISTORY) Her husband ,son and multiple family members are at the bedside.  Overall she feels her condition is unchanged. Family has decided on comfort care OBJECTIVE Temp:  [97.6 F (36.4 C)-100.4 F (38 C)] 99.7 F (37.6 C) (03/22 1200) Pulse Rate:  [65-118] 89 (03/22 1200) Cardiac Rhythm:  [-] Normal sinus rhythm (03/22 0800) Resp:  [17-23] 19 (03/22 1200) BP: (126-166)/(60-103) 146/87 mmHg (03/22 1200) SpO2:  [95 %-100 %] 100 % (03/22 1200) FiO2 (%):  [40 %] 40 % (03/22 1200)  CBC:  Recent Labs Lab 01/14/16 1920  01/15/16 0450 01/16/16 0629 01/17/16 0340  WBC 8.6  --  19.5* 5.2 13.6*  NEUTROABS 4.5  --  14.6*  --   --   HGB 13.8  < > 12.5 13.2 9.7*  HCT 41.5  < > 37.9 38.1 30.9*  MCV 90.6  --  90.9 95.7 93.1  PLT 267  --  280 130* 235  < > = values in this interval not displayed.  Basic Metabolic Panel:   Recent Labs Lab 01/16/16 0629 01/17/16 0340  NA 138 137  K 3.9 3.7  CL 101 105  CO2 28 25  GLUCOSE 115* 124*  BUN <5* 6  CREATININE 0.89 0.78  CALCIUM 8.2* 8.5*    Lipid Panel:     Component Value Date/Time   CHOL 201*  01/15/2016 0450   TRIG 177* 01/15/2016 0450   HDL 24* 01/15/2016 0450   CHOLHDL 8.4 01/15/2016 0450   VLDL 35 01/15/2016 0450   LDLCALC 142* 01/15/2016 0450   HgbA1c:  Lab Results  Component Value Date   HGBA1C 6.2* 01/15/2016   Urine Drug Screen:     Component Value Date/Time   LABOPIA NONE DETECTED 01/14/2016 2106   COCAINSCRNUR NONE DETECTED 01/14/2016 2106   LABBENZ NONE DETECTED 01/14/2016 2106   AMPHETMU NONE DETECTED 01/14/2016 2106   THCU NONE DETECTED 01/14/2016 2106   LABBARB NONE DETECTED 01/14/2016 2106      IMAGING  Ct Angio Head W/cm &/or Wo Cm 01/14/2016   1. Right M1 occlusion with infarct is seen in the insula. Large collaterals over the right parietal convexity.  2. Subacute left parietal infarct with petechial hemorrhage.  3. Fibromuscular dysplasia seen in the cervical ICAs.    Ct Angio Neck W/cm &/or Wo/cm 01/14/2016   1. Right M1 occlusion with infarct is seen in the insula. Large collaterals over the right parietal convexity.  2. Subacute left parietal infarct with petechial hemorrhage.  3. Fibromuscular dysplasia seen in the cervical ICAs.    Ct Head Wo Contrast 01/14/2016   Changes consistent with  acute to subacute ischemia in the posterior distribution of the left MCA. An old infarct is noted in the anterior aspect of the left MCA distribution.     Dg Chest Port 1 View 01/15/2016   Endotracheal tube with tip measuring 4.3 cm above the carina. Shallow inspiration. Lungs clear.    Ct Head Code Stroke W/o Cm 01/15/2016   Acute intraparenchymal hemorrhage in the right basal ganglia extending up into the centrum semiovale. Mildly increased edema and mass effect associated with the known left parietal hemorrhage.   CEREBRAL ANGIOGRAM [WUJ8119 (Custom)]      Expand All Collapse All   S/P rt vert arteriogram,and RT common carotid arteriogram,followe dpartial revascularization of occluded RT MCA M1 seg after x3 passes with the solitaire 40  mm retriever ,x 1 pass with the trevoprovue 4mm x 30 mm retriever,x 1pass with the penumbra suction device and x 2 balloon angioplasty          PHYSICAL EXAM Elderly Caucasian lady who is intubated  . Marland Kitchen Afebrile. Head is nontraumatic. Neck is supple without bruit.    Cardiac exam no murmur or gallop. Lungs are clear to auscultation. Distal pulses are well felt. She has bilateral arterial groin sheaths Neurological Exam :   . Stuporous but partially opens eyes to sternal rub. Right gaze preference. Not able to look to the left past midline. Pupils equal reactive. Fundi were not visualized. Left lower facial weakness. Tongue midline. Moves right upper and lower extremities spontaneously and briskly to sternal rub. Left upper extremity is flaccid and does not move. Able to move the left lower extremity against gravity to sternal rub. Left plantar upgoing right downgoing.Intermittent facial twitchings noted on the left     ASSESSMENT/PLAN Hayley Roth is a 82 y.o. female with history of Atrial fibrillation not anticoagulated and mild dementia presenting with left hemiparesis.. She did not receive IV t-PA due to petechiae on head CT. Status post thrombectomy  Stroke:  Non-dominant acute intraparenchymal hemorrhage in the right basal ganglia s/p mechanical embolectomy for right M 1 occlusion  Resultant comatose state and Left hemiplegia   MRI - not performed  MRA  not performed  Carotid Doppler refer to CTA of the neck  2D Echo EF 45-50%. No definite cardiac source of emboli identified.  LDL 142  HgbA1c 6.2  VTE prophylaxis - SCDs Diet NPO time specified  No antithrombotic prior to admission, now on No antithrombotic secondary to hemorrhage  Patient counseled to be compliant with her antithrombotic medications  Ongoing aggressive stroke risk factor management  Therapy recommendations: Pending  Disposition: Pending  Hypertension  Stable  Permissive hypertension (OK  if < 220/120) but gradually normalize in 5-7 days  Hyperlipidemia  Home meds:  No lipid lowering medications prior to admission  LDL 142, goal < 70  Add Lipitor 40 mg daily  Continue statin at discharge    Other Stroke Risk Factors  Advanced age  Obesity, Body mass index is 30.27 kg/(m^2).   Hx stroke/TIA - remote infarct noted on CT  Atrial fibrillation  Other Active Problems  Leukocytosis - afebrile - on vancomycin  Hypokalemia - supplemented  Repeat labs in a.m.  Hospital day # 3  I have personally examined this patient, reviewed notes, independently viewed imaging studies, participated in medical decision making and plan of care. I have made any additions or clarifications directly to the above note. Agree with note above. She presented with left hemiplegia secondary to right middle cerebral  artery occlusion and was treated with mechanical embolectomy which was technically difficult and was not fully revascularized despite multiple attempts and developed post revascularization hematoma in the right basal ganglia.   I had a long discussion with the patient` s husband and son  And multiple family members at beside regarding her prognosis which appears quite poor. Family   clearly feel that they would not want prolonged ventilatory support, tracheostomy, PEG tube. The family agreed to DO NOT RESUSCITATE and comfort care and withdrawal of ventilatory support . Hence we will initiate terminal wean and comfort care measures This patient is critically ill and at significant risk of neurological worsening, death and care requires constant monitoring of vital signs, hemodynamics,respiratory and cardiac monitoring, extensive review of multiple databases, frequent neurological assessment, discussion with family, other specialists and medical decision making of high complexity.I have made any additions or clarifications directly to the above note.This critical care time does not reflect  procedure time, or teaching time or supervisory time of PA/NP/Med Resident etc but could involve care discussion time.  I spent 40 minutes of neurocritical care time  in the care of  this patient.     Delia Heady, MD Medical Director [facility] Pager: [phone] 01/17/2016 3:27 PM     To contact Stroke Continuity provider, please refer to [email or web address]. After hours, contact General Neurology

## 2016-01-17 NOTE — Progress Notes (Signed)
Case briefly discussed with Dr. Pearlean BrownieSethi and family desire full comfort care. I will sign off at this time. Please contact me if there are any other concerns or I can be of any further help.  Donna ChristenJennings E. Jamison NeighborNestor, M.D. [facility] Pager:  [phone] After 3pm or if no response, call [phone] 1:38 PM 01/17/2016

## 2016-01-17 NOTE — Progress Notes (Signed)
OT Cancellation Note  Patient Details Name: Hayley Roth MRN: [id number] DOB: [birth year]   Cancelled Treatment:    Reason Eval/Treat Not Completed: Patient not medically ready.  Pt remains intubated and on strict bedrest.  OT will sign off.  Please re-order if she becomes medically appropriate.   Angelene GiovanniConarpe, Mikkel Charrette M  Diego Ulbricht Conejoonarpe, OTR/L [phone]  01/17/2016, 11:01 AM

## 2016-01-17 NOTE — Progress Notes (Addendum)
PULMONARY / CRITICAL CARE MEDICINE   Name: Hayley DienerMary A Roth MRN: [id number] DOB: 04-Apr-1927    ADMISSION DATE:  01/14/2016 CONSULTATION DATE:  01/14/16  REFERRING MD:  Saralyn PilarVega, J  CHIEF COMPLAINT:  Ischemic stroke  HISTORY OF PRESENT ILLNESS:   Hayley Roth is an [age]F with history of atrial fibrillation who presented to the ED as a code stroke after acute onset of left sided weakness, facial droop and slurred speech. She was initially hypertensive. Initial stroke scale 25. CT and CTA demonstrated right M1 occlusion. No tPA was given 2/2 findings of subacute stroke with petechiae. IR was consulted and took the patient for angiography with partial embolectomy; this procedure was not successful, however. She was intubated for the case and brought to the ICU on a ventilator and on propofol with paralytics still on board.   Per the notes, prior to this event she was able to live fairly independently in an assisted living facility with perhaps some very mild dementia. Her family confirms this. They state that she does have a living will and was quite clear that she would not want to be on prolonged life support. They think she would have wanted to be DNR in this situation.   SUBJECTIVE: No acute events overnight. Remains largely nonresponsive.  REVIEW OF SYSTEMS:  Unobtainable as patient is intubated.   VITAL SIGNS: BP 145/62 mmHg  Pulse 68  Temp(Src) 97.6 F (36.4 C) (Axillary)  Resp 18  Ht 5' (1.524 m)  Wt 70.3 kg (154 lb 15.7 oz)  BMI 30.27 kg/m2  SpO2 100%  HEMODYNAMICS:    VENTILATOR SETTINGS: Vent Mode:  [-] PSV;CPAP FiO2 (%):  [40 %] 40 % Set Rate:  [18 bmp] 18 bmp Vt Set:  [370 mL] 370 mL PEEP:  [5 cmH20] 5 cmH20 Pressure Support:  [5 cmH20] 5 cmH20 Plateau Pressure:  [12 cmH20] 12 cmH20  INTAKE / OUTPUT: I/O last 3 completed shifts: In: 3041.3 [I.V.:3041.3] Out: 2475 [Urine:2475]  PHYSICAL EXAMINATION: General: Eyes open. No acute distress. No family at bedside.   Integument:  Warm & dry. No rash on exposed skin.  HEENT: No scleral injection or icterus. Endotracheal tube in place.  Cardiovascular:  Regular rate. No edema. No appreciable JVD.  Pulmonary:  Clear bilaterally to auscultation. Symmetric chest wall rise on ventilator. Abdomen: Soft. Normal bowel sounds. Nondistended.  Neurological: Doesn't follow commands. Doesn't attend to voice.  LABS:  BMET  Recent Labs Lab 01/15/16 0450 01/16/16 0629 01/17/16 0340  NA 136 138 137  K 3.1* 3.9 3.7  CL 102 101 105  CO2 21* 28 25  BUN 8 <5* 6  CREATININE 0.81 0.89 0.78  GLUCOSE 236* 115* 124*    Electrolytes  Recent Labs Lab 01/15/16 0450 01/16/16 0629 01/17/16 0340  CALCIUM 8.1* 8.2* 8.5*    CBC  Recent Labs Lab 01/15/16 0450 01/16/16 0629 01/17/16 0340  WBC 19.5* 5.2 13.6*  HGB 12.5 13.2 9.7*  HCT 37.9 38.1 30.9*  PLT 280 130* 235    Coag's  Recent Labs Lab 01/14/16 1920  APTT 28  INR 1.22    Sepsis Markers  Recent Labs Lab 01/14/16 1921  LATICACIDVEN 1.40    ABG  Recent Labs Lab 01/15/16 0226  PHART 7.380  PCO2ART 38.4  PO2ART 86.0    Liver Enzymes  Recent Labs Lab 01/14/16 1920  AST 23  ALT 14  ALKPHOS 98  BILITOT 0.8  ALBUMIN 3.3*    Cardiac Enzymes No results for input(s): TROPONINI, PROBNP in  the last 168 hours.  Glucose  Recent Labs Lab 01/15/16 1955 01/15/16 2334 01/16/16 0326 01/17/16 0425  GLUCAP 124* 138* 134* 118*    Imaging No results found.   STUDIES:  Head CT 3/21 >> evolution of large right MCA territory infarct , 4mm shift. BG small bleed, subacute left MCA territory infarct   MICROBIOLOGY: Urine Ctx 3/18:  Multiple species present MRSA PCR 3/20:  Negative   ANTIBIOTICS: None  SIGNIFICANT EVENTS: 3/19 - Admit  LINES/TUBES: OETT 7.5 3/19>>> FOLEY 3/29>>> PIV x2  ASSESSMENT / PLAN:  PULMONARY A: Need for mechanical ventilation - secondary to recent procedures and mental status  P:    Failing spontaneous breathing trials Continue full vent support Likely one way extubation when family ready  CARDIOVASCULAR A:  Paroxysmal atrial fibrillation - Not on systemic anticoagulation prior to admit.  H/O Hypertension  P:  Monitor on telemetry Vitals per unit protocol Not  a candidate for anticoagulation Home meds  Held - use lopressor 5 q6 & labetalol prn Taper off Nicardipene infusion Lipitor qhs  RENAL A:   Hypokalemia - Resolved.  P:   Trending UOP with Foley Monitoring electrolytes & renal function daily  GASTROINTESTINAL A:   No acute issues  P:   NPO Protonix IV qhs  HEMATOLOGIC A:   Anemia - New. Leukocytosis - Likely stress response.  P:  CT abdomen/pelvis w/o contrast for possible RP bleeding Trending cell counts daily Repeat Hgb/Hct at 1500 today Transfuse for Hgb <7.0 SCDs  INFECTIOUS A:   No acute issues.  P:   Monitor for signs of infection.  ENDOCRINE A:   No acute issues.  P:   Monitor BG on daily   NEUROLOGIC A:   M1 territory ischemic stroke Subacute stroke w/ petechiae  P:   Neurology Following RASS goal: 0  BP goal 120-140 systolic Fentanyl IV prn  FAMILY  - Updates: No family at bedside. Plan for one way extubation when family ready.  - Inter-disciplinary family meet or Palliative Care meeting due by:  3/26  TODAY'S SUMMARY:  Hayley Roth is an 89F with M1 ischemic stroke s/p failed attempt at revascularization and not a tPA candidate. Her deficits in the ED were significant dysarthria and L sided weakness. Her family (3 of 6 children) understand that her prognosis is guarded and have changed her code status to DNR. She has a history of atrial fibrillation, not previously on anticoagulation. Patient's anemia is new which is of concern given intervention. Plan for CT abdomen/pelvis for dilation of possible retroperitoneal bleed.  I have spent a total of 36 minutes of critical care time today caring for the  patient and reviewing the patient's electronic medical record.  Donna Christen Jamison Neighbor, M.D. [facility] Pulmonary & Critical Care Pager:  [phone] After 3pm or if no response, call [phone] 01/17/2016, 9:25 AM

## 2016-01-17 NOTE — Progress Notes (Signed)
SLP Cancellation Note  Patient Details Name: Naoma DienerMary A Kaseman MRN: [id number] DOB: [birth year]   Cancelled treatment:       Reason Eval/Treat Not Completed: Medical issues which prohibited therapy. Remains on vent. Signing off. Please re consult when appropriate.   Ferdinand LangoLeah Emrick Hensch MA, CCC-SLP [phone]    Briellah Baik Meryl 01/17/2016, 7:22 AM

## 2016-01-17 NOTE — Progress Notes (Signed)
   01/17/16 1510  Clinical Encounter Type  Visited With Patient and family together;Health care provider  Visit Type Initial;Critical Care;Patient actively dying;Spiritual support  Referral From Physician  Spiritual Encounters  Spiritual Needs Prayer;Emotional  Stress Factors  Family Stress Factors Loss   Chaplain responded to an end of life consult. Chaplain met with four of patient's family, and offered prayer and emotional support for the patient and family. Chaplain services available as needed.   Jeri Lager, Chaplain 01/17/2016 3:11 PM

## 2016-01-17 NOTE — Procedures (Signed)
Extubation Procedure Note  Patient Details:   Name: Hayley Roth DOB: Jul 30, 1927 MRN: [id number]   Airway Documentation:     Evaluation  O2 sats: stable throughout Complications: No apparent complications Patient did tolerate procedure well. Bilateral Breath Sounds: Clear Suctioning: Airway Yes   Patient extubated to 2L nasal cannula per "withdrawal of life" protocol.  No complications noted.   Hayley Roth, Hayley Roth N 01/17/2016, 1:34 PM

## 2016-01-18 MED ORDER — HALOPERIDOL 1 MG PO TABS
0.5000 mg | ORAL_TABLET | ORAL | Status: DC | PRN
  Filled 2016-01-18: qty 1

## 2016-01-18 MED ORDER — FENTANYL CITRATE (PF) 100 MCG/2ML IJ SOLN
25.0000 ug | INTRAMUSCULAR | Status: DC | PRN
  Administered 2016-01-18: 25 ug via INTRAVENOUS
  Filled 2016-01-18: qty 2

## 2016-01-18 MED ORDER — HALOPERIDOL LACTATE 2 MG/ML PO CONC
0.5000 mg | ORAL | Status: DC | PRN
  Filled 2016-01-18: qty 0.3

## 2016-01-18 MED ORDER — LORAZEPAM 2 MG/ML IJ SOLN
1.0000 mg | INTRAMUSCULAR | Status: DC | PRN
  Administered 2016-01-18 – 2016-01-19 (×3): 1 mg via INTRAVENOUS
  Filled 2016-01-18 (×4): qty 1

## 2016-01-18 MED ORDER — ONDANSETRON 4 MG PO TBDP
4.0000 mg | ORAL_TABLET | Freq: Four times a day (QID) | ORAL | Status: DC | PRN
  Filled 2016-01-18: qty 1

## 2016-01-18 MED ORDER — HALOPERIDOL LACTATE 5 MG/ML IJ SOLN: 0.5000 mg | INTRAMUSCULAR | Status: DC | PRN

## 2016-01-18 MED ORDER — POLYVINYL ALCOHOL 1.4 % OP SOLN
1.0000 [drp] | Freq: Four times a day (QID) | OPHTHALMIC | Status: DC | PRN
  Filled 2016-01-18: qty 15

## 2016-01-18 MED ORDER — GLYCOPYRROLATE 0.2 MG/ML IJ SOLN
0.2000 mg | INTRAMUSCULAR | Status: DC | PRN
  Administered 2016-01-18 – 2016-01-19 (×5): 0.2 mg via INTRAVENOUS
  Filled 2016-01-18 (×5): qty 1

## 2016-01-18 MED ORDER — GLYCOPYRROLATE 1 MG PO TABS
1.0000 mg | ORAL_TABLET | ORAL | Status: DC | PRN
  Filled 2016-01-18: qty 1

## 2016-01-18 MED ORDER — BIOTENE DRY MOUTH MT LIQD: 15.0000 mL | OROMUCOSAL | Status: DC | PRN

## 2016-01-18 MED ORDER — GLYCOPYRROLATE 0.2 MG/ML IJ SOLN: 0.2000 mg | INTRAMUSCULAR | Status: DC | PRN

## 2016-01-18 MED ORDER — LORAZEPAM 1 MG PO TABS: 1.0000 mg | ORAL_TABLET | ORAL | Status: DC | PRN

## 2016-01-18 MED ORDER — ONDANSETRON HCL 4 MG/2ML IJ SOLN: 4.0000 mg | Freq: Four times a day (QID) | INTRAMUSCULAR | Status: DC | PRN

## 2016-01-18 MED ORDER — LORAZEPAM 2 MG/ML PO CONC: 1.0000 mg | ORAL | Status: DC | PRN

## 2016-01-18 NOTE — Clinical Social Work Note (Signed)
Clinical Social Work Assessment  Patient Details  Name: Hayley Roth MRN: [id number] Date of Birth: 26-Sep-1927  Date of referral:  01/18/16               Reason for consult:  Discharge Planning, Facility Placement, End of Life/Hospice                Permission sought to share information with:  Facility Sport and exercise psychologist, Family Supports Permission granted to share information::  No  Name::        Agency::     Relationship::     Contact Information:     Housing/Transportation Living arrangements for the past 2 months:  [place], [place] of Information:  Adult Children Patient Interpreter Needed:  None Criminal Activity/Legal Involvement Pertinent to Current Situation/Hospitalization:  No - Comment as needed Significant Relationships:  Adult Children Lives with:  Facility Resident Do you feel safe going back to the place where you live?  Yes Need for family participation in patient care:  Yes (Comment)  Care giving concerns:  The patient's family has chosen comfort care for the patient and want the patient to made as comfortable as possible.   Social Worker assessment / plan:  CSW completed assessment with the patient's daughter. Per daughter Hayley Roth, the patient resided at the [facility] prior to the hospitalization. She was actually receiving skilled care at the facilities SNF. The family has met with PMT and they have decided on residential hospice placement for the patient. CSW explained residential hospice placement to daughter and CSW's role. Per [facility], the facility can admit the patient tomorrow. CSW will remain available to provide support for the patient and family .  Employment status:  Disabled (Comment on whether or not currently receiving Disability) Insurance information:  Managed Medicare PT Recommendations:  Not assessed at this time Information / Referral to community resources:  Other (Comment  Required) (Residential Hospice referral made.)  Patient/Family's Response to care:  The daughter appears happy with the care the patient is receiving. She expresses appreciation for the treatment team and the assistance of CSW.  Patient/Family's Understanding of and Emotional Response to Diagnosis, Current Treatment, and Prognosis:  The daughter appears to have a good understanding of why the patient was admitted, and understands the patient's poor prognosis. Focus has been shifted to comfort at this time. The daughter appears to be coping well, and feels she has made the best decision.   Emotional Assessment Appearance:  Appears stated age Attitude/Demeanor/Rapport:  Unable to Assess Affect (typically observed):  Unable to Assess Orientation:    Alcohol / Substance use:  Not Applicable Psych involvement (Current and /or in the community):  No (Comment)  Discharge Needs  Concerns to be addressed:  Discharge Planning Concerns Readmission within the last 30 days:  No Current discharge risk:  Terminally ill Barriers to Discharge:  Other (Hosp[facility] bed available at [facility] on 3/24.)   Hayley Roth 01/18/2016, 4:23 PM

## 2016-01-18 NOTE — Progress Notes (Signed)
Nutrition Brief Note  Chart reviewed. Pt now transitioning to comfort care.  No further nutrition interventions warranted at this time.  Please re-consult as needed.   Jenette Rayson RD, LDN, CNSC [phone] Pager [phone] After Hours Pager    

## 2016-01-18 NOTE — Care Management Note (Signed)
Case Management Note  Patient Details  Name: Hayley DienerMary A Roth MRN: [id number] Date of Birth: 06-Dec-1926  Subjective/Objective:   Pt admitted on 01/14/16 with stroke.  PTA, pt resided at ALF.                   Action/Plan: Family has chosen comfort approach; CSW notified of plans.  Poss dc to residential hospice pending bed availability in [place]    Expected Discharge Date:                  Expected Discharge Plan:  Hospice Medical Facility  In-House Referral:  Clinical Social Work  Discharge planning Services  CM Consult  Post Acute Care Choice:    Choice offered to:     DME Arranged:    DME Agency:     HH Arranged:    HH Agency:     Status of Service:  In process, will continue to follow  Medicare Important Message Given:    Date Medicare IM Given:    Medicare IM give by:    Date Additional Medicare IM Given:    Additional Medicare Important Message give by:     If discussed at Long Length of Stay Meetings, dates discussed:    Additional Comments:  Quintella BatonJulie W. Dandra Velardi, RN, BSN  Trauma/Neuro ICU Case Manager [phone]

## 2016-01-18 NOTE — Progress Notes (Signed)
STROKE TEAM PROGRESS NOTE   HISTORY OF PRESENT ILLNESS Lamar Blinks Duthie is a 82 y.o. female with a hx of untreated afib who lives at an assisted living facility and is able to walk, speak and go about her life at baseline. Family tell me that she might have a small degree of dementia. Today at 5:30 while eating rather suddenly stopped moving the left side of her body. She came in as a code stroke and I examined her in the scanner but was unable to give tPA because head CT showed a subacute stroke with petechia. CTA showed right M1 occlusion and I communicated findings immediately to interventional. Spoke with family and they agreed with IR procedure. Will is power of attorney [phone].  LKW: 5:30PM tpa given?: NO 2/2 hypodensity, new with petechia Premorbid modified rankin scale: 1 ICH Score: 1 NIHSS 21   SUBJECTIVE (INTERVAL HISTORY) Her husband ,son and multiple family members are at the bedside.   . Family has decided on comfort care.Patient looks comfortable. OBJECTIVE Temp:  [98.3 F (36.8 C)] 98.3 F (36.8 C) (03/23 0000) Pulse Rate:  [98-120] 120 (03/23 1100) Cardiac Rhythm:  [-] Atrial fibrillation (03/23 0700) Resp:  [10-23] 12 (03/23 1100) SpO2:  [96 %-100 %] 96 % (03/23 1100)  CBC:  Recent Labs Lab 01/14/16 1920  01/15/16 0450 01/16/16 0629 01/17/16 0340  WBC 8.6  --  19.5* 5.2 13.6*  NEUTROABS 4.5  --  14.6*  --   --   HGB 13.8  < > 12.5 13.2 9.7*  HCT 41.5  < > 37.9 38.1 30.9*  MCV 90.6  --  90.9 95.7 93.1  PLT 267  --  280 130* 235  < > = values in this interval not displayed.  Basic Metabolic Panel:   Recent Labs Lab 01/16/16 0629 01/17/16 0340  NA 138 137  K 3.9 3.7  CL 101 105  CO2 28 25  GLUCOSE 115* 124*  BUN <5* 6  CREATININE 0.89 0.78  CALCIUM 8.2* 8.5*    Lipid Panel:     Component Value Date/Time   CHOL 201* 01/15/2016 0450   TRIG 177* 01/15/2016 0450   HDL 24* 01/15/2016 0450   CHOLHDL 8.4 01/15/2016 0450   VLDL 35 01/15/2016  0450   LDLCALC 142* 01/15/2016 0450   HgbA1c:  Lab Results  Component Value Date   HGBA1C 6.2* 01/15/2016   Urine Drug Screen:     Component Value Date/Time   LABOPIA NONE DETECTED 01/14/2016 2106   COCAINSCRNUR NONE DETECTED 01/14/2016 2106   LABBENZ NONE DETECTED 01/14/2016 2106   AMPHETMU NONE DETECTED 01/14/2016 2106   THCU NONE DETECTED 01/14/2016 2106   LABBARB NONE DETECTED 01/14/2016 2106      IMAGING  Ct Angio Head W/cm &/or Wo Cm 01/14/2016   1. Right M1 occlusion with infarct is seen in the insula. Large collaterals over the right parietal convexity.  2. Subacute left parietal infarct with petechial hemorrhage.  3. Fibromuscular dysplasia seen in the cervical ICAs.    Ct Angio Neck W/cm &/or Wo/cm 01/14/2016   1. Right M1 occlusion with infarct is seen in the insula. Large collaterals over the right parietal convexity.  2. Subacute left parietal infarct with petechial hemorrhage.  3. Fibromuscular dysplasia seen in the cervical ICAs.    Ct Head Wo Contrast 01/14/2016   Changes consistent with acute to subacute ischemia in the posterior distribution of the left MCA. An old infarct is noted in the anterior aspect of  the left MCA distribution.     Dg Chest Port 1 View 01/15/2016   Endotracheal tube with tip measuring 4.3 cm above the carina. Shallow inspiration. Lungs clear.    Ct Head Code Stroke W/o Cm 01/15/2016   Acute intraparenchymal hemorrhage in the right basal ganglia extending up into the centrum semiovale. Mildly increased edema and mass effect associated with the known left parietal hemorrhage.   CEREBRAL ANGIOGRAM [ZOX0960 (Custom)]      Expand All Collapse All   S/P rt vert arteriogram,and RT common carotid arteriogram,followe dpartial revascularization of occluded RT MCA M1 seg after x3 passes with the solitaire 40 mm retriever ,x 1 pass with the trevoprovue 4mm x 30 mm retriever,x 1pass with the penumbra suction device and x 2 balloon  angioplasty          PHYSICAL EXAM Elderly Caucasian lady who is Unresponsive. Afebrile. Head is nontraumatic. Neck is supple without bruit.    Cardiac exam no murmur or gallop. Lungs are clear to auscultation. Distal pulses are well felt.   Neurological Exam :   . Stuporous but partially opens eyes to sternal rub. Right gaze preference.   Pupils equal reactive. Fundi were not visualized. Left lower facial weakness. Tongue midline. Moves right upper and lower extremities spontaneously and briskly to sternal rub. Left upper extremity is flaccid and does not move. Able to move the left lower extremity against gravity to sternal rub. Left plantar upgoing right downgoing.Intermittent facial twitchings noted on the left     ASSESSMENT/PLAN Ms. ONEIKA SIMONIAN is a 82 y.o. female with history of Atrial fibrillation not anticoagulated and mild dementia presenting with left hemiparesis.. She did not receive IV t-PA due to petechiae on head CT. Status post thrombectomy  Stroke:  Non-dominant acute intraparenchymal hemorrhage in the right basal ganglia s/p mechanical embolectomy for right M 1 occlusion  Resultant comatose state and Left hemiplegia  now made comfort care  MRI - not performed  MRA  not performed  Carotid Doppler refer to CTA of the neck  2D Echo EF 45-50%. No definite cardiac source of emboli identified.  LDL 142  HgbA1c 6.2  VTE prophylaxis - SCDs Diet NPO time specified  No antithrombotic prior to admission, now on No antithrombotic secondary to hemorrhage  Patient counseled to be compliant with her antithrombotic medications  Ongoing aggressive stroke risk factor management  Therapy recommendations: Pending  Disposition: Pending  Hypertension  Stable  Permissive hypertension (OK if < 220/120) but gradually normalize in 5-7 days  Hyperlipidemia  Home meds:  No lipid lowering medications prior to admission  LDL 142, goal < 70  Add Lipitor 40 mg  daily  Continue statin at discharge    Other Stroke Risk Factors  Advanced age  Obesity, Body mass index is 30.27 kg/(m^2).   Hx stroke/TIA - remote infarct noted on CT  Atrial fibrillation  Other Active Problems  Leukocytosis - afebrile - on vancomycin  Hypokalemia - supplemented  Repeat labs in a.m.  Hospital day # 4  I have personally examined this patient, reviewed notes, independently viewed imaging studies, participated in medical decision making and plan of care. I have made any additions or clarifications directly to the above note. Agree with note above. She presented with left hemiplegia secondary to right middle cerebral artery occlusion and was treated with mechanical embolectomy which was technically difficult and was not fully revascularized despite multiple attempts and developed post revascularization hematoma in the right basal ganglia.  I had a long discussion with the patient` s husband and son  And multiple family members at beside regarding her prognosis which appears quite poor. The family agreed to DO NOT RESUSCITATE and comfort care and withdrawal of ventilatory support . Patient is now on comfort care measures only Transfer out of the unit to the palliative care floor today. Continue comfort care measures Long discussion at the bedside with patient's family. They are requesting palliative care consult.    Delia HeadyPramod Sethi, MD Medical Director [facility] Pager: [phone] 01/18/2016 1:58 PM     To contact Stroke Continuity provider, please refer to [email or web address]. After hours, contact General Neurology

## 2016-01-18 NOTE — Progress Notes (Addendum)
Palliative Medicine Team Initial screen: Patient is in bed, non-responsive. On morphine gtt with titration orders in place. After discussion with Dr Phillips OdorGolding, additional comfort meds were ordered, including robinul and lorazepam. Pt is actively dying, and family has elected pure comfort care, specifically stating that they DO NOT want any treatments that could artificially extend life. Family requests [facility] due to where they live. Order for referral placed per Dr Phillips OdorGolding and family wishes; referral called and faxed. Family requests transfer asap. Paged Dr Pearlean BrownieSethi for update.  Donn PieriniMelanie G. Oliver, RN, BSN, [facility] 01/18/2016 2:07 PM Cell [phone] 8:00-4:00 Monday-Friday Office [phone]  Patient is stable for transfer today if bed is available.Prognosis is <1 week.  Anderson MaltaElizabeth Azlyn Wingler, DO Palliative Medicine

## 2016-01-19 MED ORDER — SODIUM CHLORIDE 0.9 % IV SOLN: 4.0000 mg/h | INTRAVENOUS | Status: DC

## 2016-01-19 MED ORDER — LORAZEPAM 2 MG/ML IJ SOLN: 1.0000 mg | INTRAMUSCULAR | Status: DC | PRN

## 2016-01-19 MED ORDER — MORPHINE SULFATE (PF) 2 MG/ML IV SOLN
2.0000 mg | Freq: Once | INTRAVENOUS | Status: AC
  Administered 2016-01-19: 2 mg via INTRAVENOUS
  Filled 2016-01-19: qty 1

## 2016-01-19 MED ORDER — LORAZEPAM 2 MG/ML IJ SOLN
1.0000 mg | Freq: Once | INTRAMUSCULAR | Status: AC
  Administered 2016-01-19: 1 mg via INTRAVENOUS
  Filled 2016-01-19: qty 1

## 2016-01-19 NOTE — Discharge Summary (Signed)
Physician Discharge Summary  Patient ID: Hayley Roth MRN: [id number] DOB/AGE: 1927/09/29 82 y.o.  Admit date: 01/14/2016 Discharge date: 01/19/2016  Admission Diagnoses: Code Stroke  Discharge Diagnoses: Right middle cerebral artery infarct secondary to embolism from atrial fibrillation due to right M1 middle cerebral artery occlusion treated with mechanical embolectomy with only partial recanalization. Subacute left parietal MCA branch infarct and remote age left frontal MCA branch infarct. Patient made DO NOT RESUSCITATE and comfort care due to poor prognosis Active Problems:   Stroke due to embolism of left middle cerebral artery (HCC)   Cytotoxic brain edema (HCC)   ICH (intracerebral hemorrhage) (HCC)   Discharged Condition: poor  Hospital Course: Hayley Roth is a 82 y.o. female with a hx of untreated afib who lives at an assisted living facility and is able to walk, speak and go about her life at baseline. Family tell me that she might have a small degree of dementia. Today at 5:30 while eating rather suddenly stopped moving the left side of her body. She came in as a code stroke and I examined her in the scanner but was unable to give tPA because head CT showed a subacute stroke with petechia. CTA showed right M1 occlusion and I communicated findings immediately to interventional. Spoke with family and they agreed with IR procedure. Will is power of attorney [phone]. LKW: 5:30PM tpa given?: NO 2/2 hypodensity, new with petechia Premorbid modified rankin scale: 1 ICH Score: 1 NIHSS 21 Patient was admitted to the hospital and after written informed consent from family members she was taken for urgent mechanical embolectomy by Dr. Corliss Skains but he achieved only partial revascularization of the occluded right M1 segment after 3 passes with solitaire device and 1pass with Trevo and penumbra suction device and balloon angioplasty 2. Patient was admitted to intensive care unit  and was intubated and managed initially by critical care service as well. The patient did not show significant improvement. Postprocedure CT scan raised the question of right basal ganglia hematoma and follow-up CT scan showed significant clearance of this with only a small hematoma but large right MCA infarct with cytotoxic edema was noted. Patient's chances of surviving without prolonged ventilatory support, feeding and trach and PEG tube were minimal. After this was explained to the family understood her poor prognosis and given that the patient would not want to be kept alive with prolonged ventilatory support and PEG tube and elected to make patient DO NOT RESUSCITATE and comfort care. Patient was placed on morphine drip for comfort and was transported to it in a stable condition to inpatient hospice of [facility]. Consults: pulmonary/intensive care and  interventional neuroradiology  Significant Diagnostic Studies:    Ct Angio Head W/cm &/or Wo Cm 01/14/2016  1. Right M1 occlusion with infarct is seen in the insula. Large collaterals over the right parietal convexity.  2. Subacute left parietal infarct with petechial hemorrhage.  3. Fibromuscular dysplasia seen in the cervical ICAs.    Ct Angio Neck W/cm &/or Wo/cm 01/14/2016  1. Right M1 occlusion with infarct is seen in the insula. Large collaterals over the right parietal convexity.  2. Subacute left parietal infarct with petechial hemorrhage.  3. Fibromuscular dysplasia seen in the cervical ICAs.    Ct Head Wo Contrast 01/14/2016  Changes consistent with acute to subacute ischemia in the posterior distribution of the left MCA. An old infarct is noted in the anterior aspect of the left MCA distribution.     Dg Chest [facility]  1 View 01/15/2016  Endotracheal tube with tip measuring 4.3 cm above the carina. Shallow inspiration. Lungs clear.    Ct Head Code Stroke W/o Cm 01/15/2016  Acute intraparenchymal hemorrhage in  the right basal ganglia extending up into the centrum semiovale. Mildly increased edema and mass effect associated with the known left parietal hemorrhage.   CEREBRAL ANGIOGRAM [ZOX0960[[id number] (Custom)]      Expand All Collapse All  S/P rt vert arteriogram,and RT common carotid arteriogram,followe dpartial revascularization of occluded RT MCA M1 seg after x3 passes with the solitaire 4mmx 40 mm retriever ,x 1 pass with the trevoprovue 4mm x 30 mm retriever,x 1pass with the penumbra suction device and x 2 balloon angioplasty              Discharge Exam: Blood pressure 91/75, pulse 75, temperature 102.8 F (39.3 C), temperature source Oral, resp. rate 19, height 5' (1.524 m), weight 154 lb 15.7 oz (70.3 kg), SpO2 96 %.  PHYSICAL EXAM Elderly Caucasian lady who is Unresponsive. Afebrile. Head is nontraumatic. Neck is supple without bruit. Cardiac exam no murmur or gallop. Lungs are clear to auscultation. Distal pulses are well felt.  Neurological Exam :  . Stuporous but partially opens eyes to sternal rub. Right gaze preference. Pupils equal reactive. Fundi were not visualized. Left lower facial weakness. Tongue midline. Moves right upper and lower extremities spontaneously and briskly to sternal rub. Left upper extremity is flaccid and does not move. Able to move the left lower extremity against gravity to sternal rub. Left plantar upgoing right downgoing.Intermittent facial twitchings noted on the left  Disposition:Hospice at [facility]    Home Discharge Medications   Medication List    STOP taking these medications        acetaminophen 500 MG tablet  Commonly known as:  TYLENOL     bisoprolol 10 MG tablet  Commonly known as:  ZEBETA     Cholecalciferol 4000 units Caps     citalopram 20 MG tablet  Commonly known as:  CELEXA     cloNIDine 0.1 MG tablet  Commonly known as:  CATAPRES     levothyroxine 88 MCG tablet  Commonly known as:  SYNTHROID,  LEVOTHROID     lidocaine 5 %  Commonly known as:  LIDODERM     losartan-hydrochlorothiazide 100-12.5 MG tablet  Commonly known as:  HYZAAR     methocarbamol 500 MG tablet  Commonly known as:  ROBAXIN     ondansetron 4 MG tablet  Commonly known as:  ZOFRAN     oxyCODONE 5 MG immediate release tablet  Commonly known as:  Oxy IR/ROXICODONE     polyethylene glycol packet  Commonly known as:  MIRALAX / GLYCOLAX     PRESERVISION AREDS Caps     senna 8.6 MG Tabs tablet  Commonly known as:  SENOKOT     ZINC OXIDE EX      TAKE these medications        LORazepam 2 MG/ML injection  Commonly known as:  ATIVAN  Inject 0.5 mLs (1 mg total) into the vein every 4 (four) hours as needed for anxiety.     morphine 250 mg in sodium chloride 0.9 % 240 mL  Inject 4 mg/hr into the vein continuous. Titrate morphine infusion per hospice protocol. Prepare and dispense infusion per pharmacy protocol for hospice care.        If d/c to Inpatient Rehab, Medications to to continued on Rehab . LORazepam  1 mg Intravenous Once  .  morphine injection  2 mg Intravenous Once     time spent on discharge summary 40 minutes  Signed: SETHI,PRAMOD 01/19/2016, 12:58 PM

## 2016-01-19 NOTE — Progress Notes (Signed)
Palliative Care RN Note: Faxed hard rx for morphine continuous infusion to  [phone]). Called Amil AmenJulia to confirm receipt; she states that the fax goes to their email addresses and takes "a few extra minutes" because of that. She will call me to let me know when it is rec'd. Dr Phillips OdorGolding has put in d/c orders so transfer can be arranged as soon as morphine is ready at inpt hospice. SW Beverely PaceBryant is out today; left voicemail for the covering SW to update. Donn PieriniMelanie G. Oliver, RN, BSN, [facility] 01/19/2016 10:35 AM Cell [phone] 8:00-4:00 Monday-Friday Office [phone]

## 2016-01-19 NOTE — Progress Notes (Signed)
Received call from [facility] [place] [facility] with [facility].  They need Rx for IV Morphine faxed to their pharmacy as soon as possible to be prepared for her arrival to their facility.   There is a 4 hour turnaround time for med, per Delice Bisonara with [facility].  Spoke with Herbert SetaHeather, 6N case manager; she states that Shirlean MylarMelanie Oliver with Hospice was taking care of this.  Left message for Shirlean MylarMelanie Oliver.  [facility] [phone].    Quintella BatonJulie W. Rydan Gulyas, RN, BSN  Trauma/Neuro ICU Case Manager [phone]

## 2016-01-19 NOTE — Progress Notes (Signed)
Report called to [facility] and given to Gevena Martebbie Guyer. [facility]AR in route to transport patient to facility. Family at bedside. Belongings sent with family. IV left in place for continue care at hospice.

## 2016-01-19 NOTE — Progress Notes (Signed)
Family anxious re: transfer to hospice facility. I have expedited discharge by completing discharge orders in Epic and providing hard scripts. Discharge order also placed. Primary service will need to complete discharge summary.  [phone] Anderson MaltaElizabeth Delissa Silba, DO Palliative Medicine

## 2016-01-19 NOTE — Care Management Important Message (Signed)
Important Message  Patient Details  Name: Hayley Roth MRN: [id number] Date of Birth: [birth year]   Medicare Important Message Given:  Yes    Oralia RudMegan P Jasdeep Kepner 01/19/2016, 12:56 PM

## 2016-01-19 NOTE — Clinical Social Work Note (Signed)
Patient to be d/c'ed today to [facility].  Patient and family agreeable to plans will transport via ems RN to call report [phone].  Windell MouldingEric Tamaj Jurgens, MSW, Theresia MajorsLCSWA [phone]

## 2016-01-19 NOTE — Progress Notes (Signed)
75 mL of morphine gtt wasted in sink. Witnessed by Clayborne DanaPatti, Charity fundraiserN.

## 2016-01-23 ENCOUNTER — Ambulatory Visit: Payer: Self-pay

## 2016-01-27 DEATH — deceased

## 2017-06-25 IMAGING — XA IR PERCUTANEOUS ART THORMBECTOMY/INFUSION INTRACRANIAL INCLUDE D
1 series · 8 of 24 positions shown · IV contrast (IODINE)
Comparison: none

CLINICAL DATA: Unresponsive, right gaze deviation and left sided
weakness. Abnormal CT angiogram with right-sided M1 occlusion.

[Series 300: dr. (person_name) · 8 of 300 slices shown]
[im 14/300]
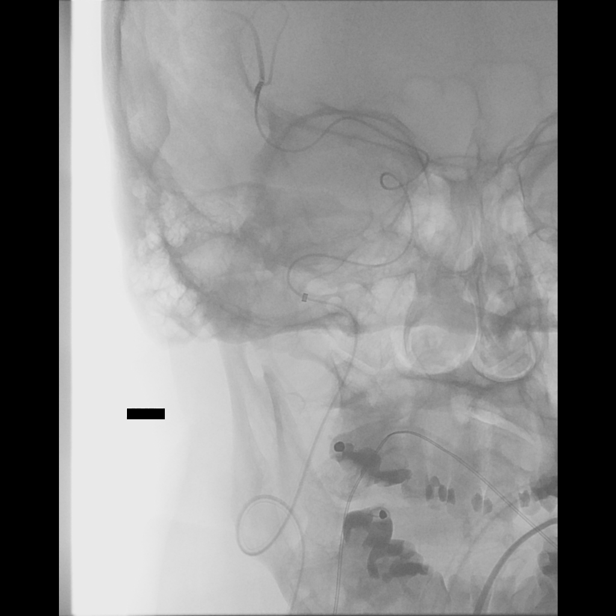
[im 53/300]
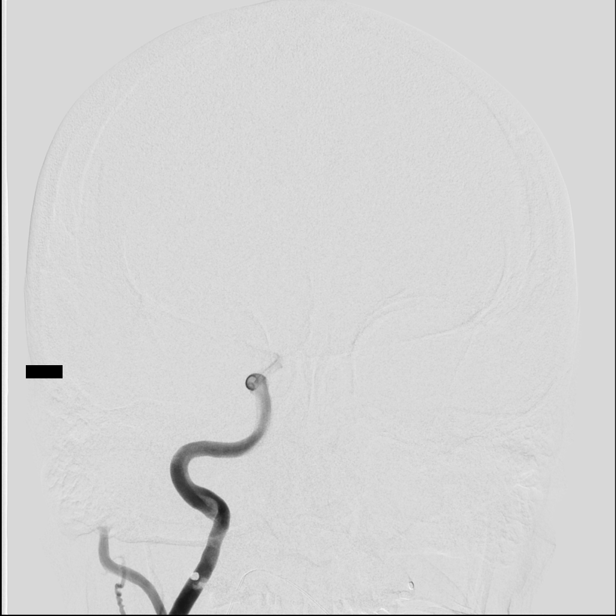
[im 92/300]
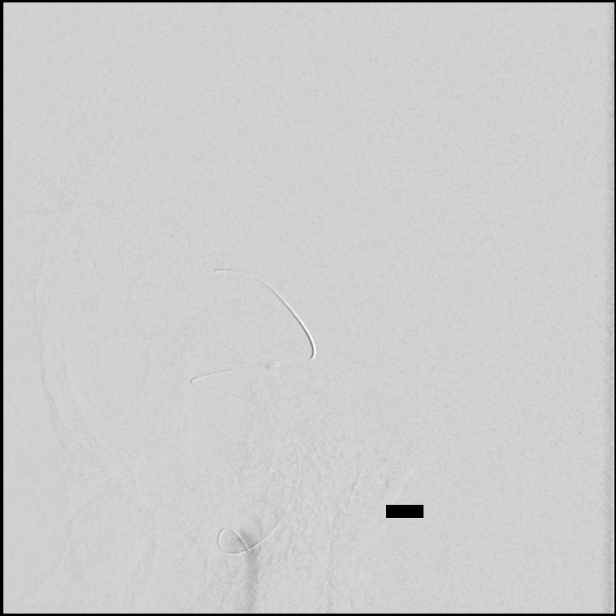
[im 131/300]
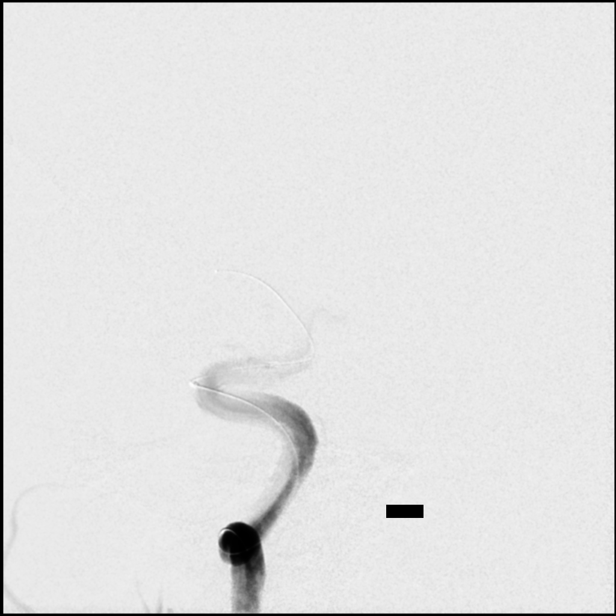
[im 170/300]
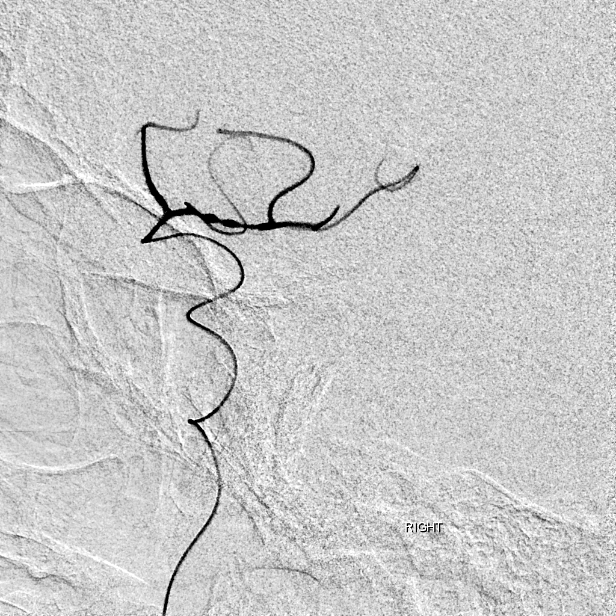
[im 209/300]
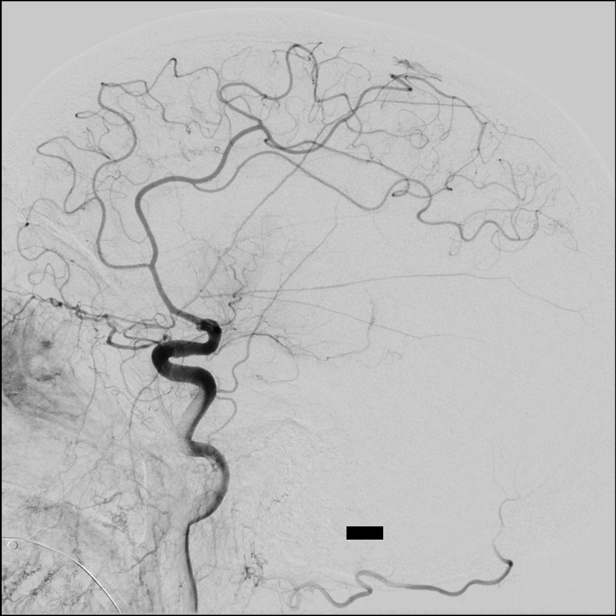
[im 248/300]
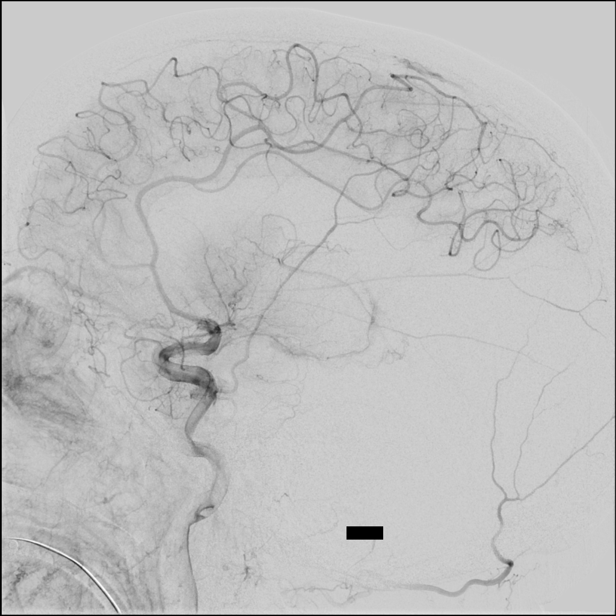
[im 287/300]
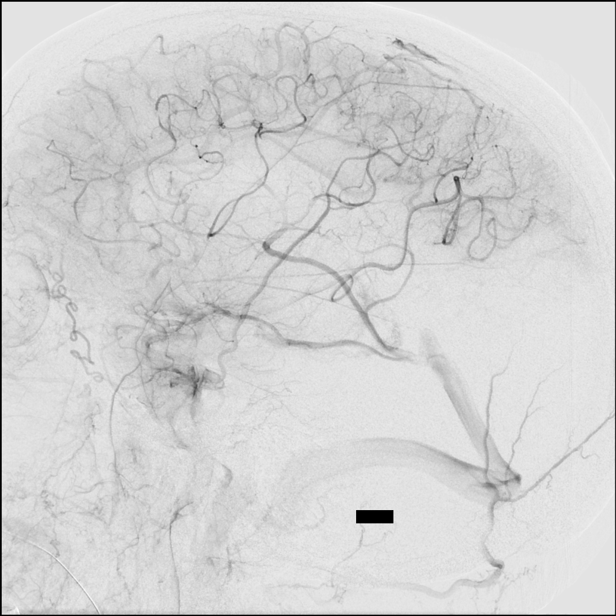

[8 of 24 positions shown; findings below may reference images not displayed]

EXAM:
IR PERCUTANEOUS ART THROMBECTOMY/INFUSION INTRACRANIAL INCLUDE DIAG
ANGIO ENDOVASCULAR TREATMENT OF OCCLUDED RIGHT MIDDLE CEREBRAL
ARTERY OCCLUSION USING SUPERSELECTIVE INTRACRANIAL INTEGRELIN,
MECHANICAL THROMBECTOMY, PENUMBRA ASPIRATION, BALLOON ANGIOPLASTY:

PROCEDURE:
Contrast: Omnipaque 300 approximately 170 cc.

Anesthesia/Sedation:  General anesthesia.

Medications: As per general anesthesia.

Following a full explanation of the procedure along with the
potential associated complications, an informed witnessed consent
was obtained. There was extensive discussion with the patient's son
and daughter regarding the patient's most recent ischemic event, and
also the chronic and the subacute ischemic event involving the left
cerebral hemisphere. Given the scenario for age, and above, the
patient's outcome would not be as favorable if the patient did not
have the above findings. The patient apparently was fully
independent and taking care of herself and involved in her daily
activities prior to this most recent event. Patient was also deemed
not to be an IV tPA candidate because of the above.

Risks of intracranial hemorrhage of 10-15%, worsening neurological
deficit, inability to revascularize, ventilator dependency, and
death were all discussed in detail with the patient's son and
daughter. Having considered and understood the above discussion,
consent was obtained for endovascular revascularization.

The patient was put under general anesthesia by the [REDACTED] at [HOSPITAL].

The right groin was prepped and draped in the usual sterile fashion.
Thereafter using modified Seldinger technique, transfemoral access
into the right common femoral artery was obtained without
difficulty. Over a 0.035 inch guidewire a 5 French Pinnacle sheath
was inserted. Through this, and also over a 0.035 inch guidewire a 5
French JB 1 catheter was advanced to the aortic arch region and
selectively positioned in the right subclavian artery and the right
common carotid artery. Arteriograms were then perfor[REDACTED]ed
intracranially and extracranially.

There were no acute complications. The patient tolerated the
procedure well.
FINDINGS: The right vertebral artery origin is widely patent.

The vessel is seen to opacify to the cranial skull base.
Opacification is seen to the right posterior-inferior cerebellar
artery and just distal to this.

The right common carotid arteriogram demonstrates the right external
carotid artery and its major branches to be widely patent.

The right internal carotid artery at the bulb to the cranial skull
base is seen to opacify normally.

The petrous, cavernous and the supraclinoid segments are widely
patent.

The right anterior cerebral artery is seen to opacify into the
capillary and the venous phases with delayed arterial phase
demonstrating retrograde opacification of the subcortical frontal
distribution. A large area of hyperperfusion is seen involving the
right MCA distribution otherwise with opacification noted of the
right anterior choroidal artery and the lateral lenticulostriates.

Complete occlusion of the right middle cerebral artery M1 segment
was otherwise noted.

ENDOVASCULAR TREATMENT OF OCCLUDED RIGHT MIDDLE CEREBRAL A1 SEGMENT
WITH PARTIAL REVASCULARIZATION FOLLOWING SUPERSELECTIVE INTRACRANIAL
INTRA-ARTERIAL INFUSION OF APPROXIMATELY 6 MG INTEGRELIN, AND 3
PASSES WITH THE SOLITAIRE 4 X 40 MM RETRIEVAL DEVICE, ONE PASS WITH
THE TREVO-PROVUE 4 X 30 MM RETRIEVAL DEVICE, ONE PASS WITH THE
PENUMBRA ASPIRATION DEVICE, AND TWO BALLOON ANGIOPLASTIES OF THE
OCCLUDED RIGHT MIDDLE CEREBRAL ARTERY M1 SEGMENT:

The diagnostic JB 1 catheter in the right common carotid artery was
exchanged over a 0.035 inch 300 cm Rosen exchange guidewire for a 55
cm 8 French BriteTip neurovascular sheath using biplane roadmap
technique and constant fluoroscopic guidance. Good aspiration was
obtained from the side port of the neurovascular sheath. A gentle
contrast injection demonstrated no evidence of spasms, dissections
or of intraluminal filling defects.

This was then connected to continuous heparinized saline infusion.

An 8 French, 85 cm Flowgate balloon guide catheter which had been
prepped with 50% contrast and 50% heparinized saline infusion was
then advanced and positioned just proximal to the right common
carotid bifurcation. The guidewire was removed. Good aspiration was
obtained from the hub of the 8 French Flowgate guide catheter. A
gentle contrast injection demonstrated no evidence of spasms,
dissections or of intraluminal filling defects.

The 8 French Flowgate guide catheter was then advanced to the
proximal [DATE] of the right internal carotid artery over a 0.035 inch
Roadrunner guidewire.

After having confirmed safe positioning and flow at the distal end
of the Flowgate guide catheter, a combination of a 5 French
Neuroflow 115 cm guide catheter and an 021 Trevo-ProVue
microcatheter was then advanced over 0.014 inch Softip Synchro micro
guidewire to the distal end of the 8 French Flowgate guide catheter.

With the micro guidewire leading with a J-tip configuration, the
combination was advanced without difficulty to the supraclinoid
right ICA. The micro guidewire was then advanced without difficulty
through the occluded right middle cerebellar artery into the
inferior division followed by the microcatheter.

Micro guidewire was then retrieved and removed. Slow flow was noted
at the distal end of the microcatheter.

This prompted the use of approximately 6 mg of superselective
intracranial intra-arterial Integrelin over about 6 minutes. At the
end of this, good aspiration was obtained from the hub of the
microcatheter. At this time, a 4 x 40 mm Solitaire FR stent
retrieval device was then advanced in a coaxial manner and with
constant heparinized saline infusion and advanced to the distal end
of the microcatheter.

The O ring on the delivery micro catheter and the delivery micro
guidewire were then released. With slight forward gentle traction
with the right hand on the delivery micro guidewire with the left
hand, the delivery microcatheter was retrieved unsheathing and
deploying the retrieval device.

A control arteriogram performed through the 5 French Neuroflow guide
catheter demonstrated near complete revascularization of the
previously occluded right middle cerebral distribution. A filling
defect was noted in the mid portion of the retrieval device.

At this time, the proximal portion of the retrieval device was
captured into the microcatheter. The balloon guide catheter was
inflated in the right internal carotid artery for proximal flow
arrest. Thereafter with constant aspiration with a 60 mL syringe at
the side port of the Flowgate guide catheter hub, the combination of
the retrieval device the microcatheter and the Neuroflow 5 French
guide catheter were gently retrieved and removed as aspiration was
continued, also aspiration was continued as the balloon was then
deflated.

No clots were seen in the aspirate.

A control arteriogram performed through the 8 French Flowgate guide
catheter in the right internal carotid artery continued to
demonstrate a completely occluded right middle cerebral artery
proximally.

Thereafter, two more attempts were made of retrieval with the
Solitaire 4 x 40 mm retrieval device, and one pass with the
Trevo-ProVue 4 x 30 mm device each time these being deployed as
mentioned above, after having been confirmed safe positioning of the
distal end of the microcatheter in the dominant inferior division of
the right middle cerebral artery. After the deployment of the these
devices a follow-up angiogram performed through the 5 French
Neuroflow guide catheter demonstrated antegrade flow through the
stented segment. However following retrieval of the microcatheter
and the device with the 5 French Arrow guide catheter and
aspiration, with proximal flow arrest, follow-up arteriograms
demonstrated continued occlusion of the right middle cerebral artery
proximally.

Following this, a combination of the Trevo-ProVue microcatheter
inside of a 5 French 115 cm Mouje guide catheter was advanced such
that the microcatheter was distal to the occlusion, and the Mouje
microcatheter was just inside the clot. Thereafter, the Trevo-ProVue
retrieval device was deployed as mentioned above followed by
constant aspiration using a 60 mL syringe through the hub of the 8
French Flowgate guide catheter while constant vacuum aspiration was
performed at the hub of the 5 [place] Sow guide catheter. This was
continued as the Trevo-ProVue microcatheter and the retrieval device
were then retrieved and removed while aspiration was continued. The
aspiration was then stopped when free flow was noted.

A control arteriogram performed through the Mouje 5 French guide
catheter in the right internal carotid artery in the supraclinoid
segment demonstrated continued complete angiographic occlusion of
the right middle cerebral artery.

This prompted the advancement of a 2 x 15 mm [REDACTED] balloon guide
catheter over a 0.014 inch Softip Synchro micro guidewire. The micro
guidewire was deployed in the M2 region of the inferior division of
the right middle cerebral artery and the balloon was then advanced
such that there was coverage from the proximal to the distal end.
The balloon had been prepped with 50% contrast and 50% heparinized
saline infusion. Control angioplasty was then performed with slow
gentle 0.5 atmospheric increments using a micro inflation syringe
device via micro tubing.

The first angioplasty, balloon was inflated to 3.5 atmospheres
achieving a diameter of 1.92 mm. The balloon was left inflated for
approximately 30 seconds. It was then retrieved proximally. A
control arteriogram performed through the 5 [place] Sow guide
catheter demonstrated continued occlusion.

A more distal angioplasty was then performed with the balloon this
time again achieving 3.6 atmospheres with a 1.92 mm diameter. The
balloon was left inflated for approximately 30 seconds. It was then
deflated and retrieved proximally. A control arteriogram performed
demonstrated antegrade flow through the angioplastied segment into
the right middle cerebral artery distribution.

However a repeat angiogram performed 5 minutes after the angioplasty
demonstrated complete reocclusion of the angioplastied segment of
the right middle cerebral artery, with no visualization of a
prominent anterior perisylvian branch from the right middle cerebral
artery. Given the time interval and also after having attempted with
multiple devices, it was elected to terminate the procedure.

During the procedure there were no acute changes in the patient's
blood pressure or heart rate, or neurological status. No
angiographic mass effect or of extravasation was seen.

The 5 [place] Sow guide catheter and the 8 French Flowgate guide
catheter was then retrieved and removed. The 8 French BriteTip
neurovascular sheath was then retrieved into the abdominal aorta and
exchanged over a J-tip guidewire for a 9 French Pinnacle sheath.
This was then connected to continuous heparinized saline infusion.
The patient was then transported to the CT scanner for
postprocedural CT scan of the brain.
IMPRESSION: Endovascular treatment of occluded right middle cerebral artery M1
segment with partial revascularization, following 3 passes with the
solitaire FR 4 x 40 mm retrieval device, one pass with the
Trevo-ProVue 4 x 30 mm retrieval device, and 1 pass with the
Penumbra suction device, and two balloon angioplasties of the
occluded right middle cerebral artery as described above. A TICI 1B
reperfusion was obtained.
